# Patient Record
Sex: Female | Born: 1960 | Race: Black or African American | Hispanic: No | Marital: Single | Smoking: Current every day smoker
Health system: Southern US, Community
[De-identification: ages and names within clinical notes are randomized; demographics above are authoritative.]

## PROBLEM LIST (undated history)

## (undated) ENCOUNTER — Ambulatory Visit (HOSPITAL_COMMUNITY)

## (undated) DIAGNOSIS — H409 Unspecified glaucoma: Secondary | ICD-10-CM

## (undated) DIAGNOSIS — E119 Type 2 diabetes mellitus without complications: Secondary | ICD-10-CM

## (undated) DIAGNOSIS — D219 Benign neoplasm of connective and other soft tissue, unspecified: Secondary | ICD-10-CM

## (undated) DIAGNOSIS — L732 Hidradenitis suppurativa: Secondary | ICD-10-CM

## (undated) DIAGNOSIS — R569 Unspecified convulsions: Secondary | ICD-10-CM

## (undated) DIAGNOSIS — G35 Multiple sclerosis: Secondary | ICD-10-CM

## (undated) DIAGNOSIS — I10 Essential (primary) hypertension: Secondary | ICD-10-CM

## (undated) HISTORY — PX: MOUTH SURGERY: SHX715

## (undated) HISTORY — DX: Unspecified glaucoma: H40.9

## (undated) HISTORY — PX: UTERINE FIBROID SURGERY: SHX826

## (undated) HISTORY — PX: PERINEAL HIDRADENITIS EXCISION: SUR524

## (undated) HISTORY — DX: Multiple sclerosis: G35

---

## 1998-04-19 ENCOUNTER — Ambulatory Visit (HOSPITAL_COMMUNITY): Admission: RE | Admit: 1998-04-19 | Discharge: 1998-04-19 | Payer: Self-pay | Admitting: Specialist

## 1998-04-22 ENCOUNTER — Ambulatory Visit (HOSPITAL_BASED_OUTPATIENT_CLINIC_OR_DEPARTMENT_OTHER): Admission: RE | Admit: 1998-04-22 | Discharge: 1998-04-22 | Payer: Self-pay | Admitting: Specialist

## 1999-09-07 ENCOUNTER — Emergency Department (HOSPITAL_COMMUNITY): Admission: EM | Admit: 1999-09-07 | Discharge: 1999-09-07 | Payer: Self-pay | Admitting: Family Medicine

## 1999-11-08 ENCOUNTER — Emergency Department (HOSPITAL_COMMUNITY): Admission: EM | Admit: 1999-11-08 | Discharge: 1999-11-08 | Payer: Self-pay

## 2000-02-28 ENCOUNTER — Emergency Department (HOSPITAL_COMMUNITY): Admission: EM | Admit: 2000-02-28 | Discharge: 2000-02-28 | Payer: Self-pay | Admitting: Emergency Medicine

## 2000-03-22 ENCOUNTER — Encounter: Payer: Self-pay | Admitting: Emergency Medicine

## 2000-03-22 ENCOUNTER — Emergency Department (HOSPITAL_COMMUNITY): Admission: EM | Admit: 2000-03-22 | Discharge: 2000-03-22 | Payer: Self-pay | Admitting: Unknown Physician Specialty

## 2000-04-18 ENCOUNTER — Emergency Department (HOSPITAL_COMMUNITY): Admission: EM | Admit: 2000-04-18 | Discharge: 2000-04-18 | Payer: Self-pay | Admitting: Emergency Medicine

## 2001-03-13 ENCOUNTER — Emergency Department (HOSPITAL_COMMUNITY): Admission: EM | Admit: 2001-03-13 | Discharge: 2001-03-13 | Payer: Self-pay | Admitting: Emergency Medicine

## 2002-02-06 ENCOUNTER — Emergency Department (HOSPITAL_COMMUNITY): Admission: EM | Admit: 2002-02-06 | Discharge: 2002-02-06 | Payer: Self-pay | Admitting: Emergency Medicine

## 2002-02-06 ENCOUNTER — Encounter: Payer: Self-pay | Admitting: Emergency Medicine

## 2002-12-01 ENCOUNTER — Encounter: Payer: Self-pay | Admitting: Emergency Medicine

## 2002-12-01 ENCOUNTER — Emergency Department (HOSPITAL_COMMUNITY): Admission: EM | Admit: 2002-12-01 | Discharge: 2002-12-01 | Payer: Self-pay | Admitting: Emergency Medicine

## 2004-02-12 ENCOUNTER — Inpatient Hospital Stay (HOSPITAL_COMMUNITY): Admission: AD | Admit: 2004-02-12 | Discharge: 2004-02-12 | Payer: Self-pay | Admitting: *Deleted

## 2004-12-11 ENCOUNTER — Encounter: Admission: RE | Admit: 2004-12-11 | Discharge: 2004-12-11 | Payer: Self-pay | Admitting: Internal Medicine

## 2005-11-25 ENCOUNTER — Emergency Department (HOSPITAL_COMMUNITY): Admission: EM | Admit: 2005-11-25 | Discharge: 2005-11-25 | Payer: Self-pay | Admitting: Family Medicine

## 2006-02-16 ENCOUNTER — Emergency Department (HOSPITAL_COMMUNITY): Admission: EM | Admit: 2006-02-16 | Discharge: 2006-02-16 | Payer: Self-pay | Admitting: Family Medicine

## 2006-04-07 ENCOUNTER — Emergency Department (HOSPITAL_COMMUNITY): Admission: EM | Admit: 2006-04-07 | Discharge: 2006-04-07 | Payer: Self-pay | Admitting: Family Medicine

## 2006-04-27 ENCOUNTER — Emergency Department (HOSPITAL_COMMUNITY): Admission: EM | Admit: 2006-04-27 | Discharge: 2006-04-27 | Payer: Self-pay | Admitting: Family Medicine

## 2006-05-18 ENCOUNTER — Encounter: Admission: RE | Admit: 2006-05-18 | Discharge: 2006-05-18 | Payer: Self-pay | Admitting: Obstetrics and Gynecology

## 2006-09-14 ENCOUNTER — Emergency Department (HOSPITAL_COMMUNITY): Admission: EM | Admit: 2006-09-14 | Discharge: 2006-09-14 | Payer: Self-pay | Admitting: Family Medicine

## 2006-10-25 ENCOUNTER — Emergency Department (HOSPITAL_COMMUNITY): Admission: EM | Admit: 2006-10-25 | Discharge: 2006-10-25 | Payer: Self-pay | Admitting: Family Medicine

## 2007-02-01 ENCOUNTER — Emergency Department (HOSPITAL_COMMUNITY): Admission: EM | Admit: 2007-02-01 | Discharge: 2007-02-01 | Payer: Self-pay | Admitting: Family Medicine

## 2009-02-05 ENCOUNTER — Encounter: Admission: RE | Admit: 2009-02-05 | Discharge: 2009-02-05 | Payer: Self-pay | Admitting: Infectious Diseases

## 2009-12-23 ENCOUNTER — Emergency Department (HOSPITAL_COMMUNITY): Admission: EM | Admit: 2009-12-23 | Discharge: 2009-12-23 | Payer: Self-pay | Admitting: Emergency Medicine

## 2012-02-29 ENCOUNTER — Other Ambulatory Visit: Payer: Self-pay

## 2012-02-29 ENCOUNTER — Emergency Department (INDEPENDENT_AMBULATORY_CARE_PROVIDER_SITE_OTHER)
Admission: EM | Admit: 2012-02-29 | Discharge: 2012-02-29 | Disposition: A | Discharge status: Home / Self Care | Payer: Self-pay | Source: Home / Self Care | Attending: Emergency Medicine | Admitting: Emergency Medicine

## 2012-02-29 ENCOUNTER — Encounter (HOSPITAL_COMMUNITY): Payer: Self-pay | Admitting: *Deleted

## 2012-02-29 DIAGNOSIS — S39012A Strain of muscle, fascia and tendon of lower back, initial encounter: Secondary | ICD-10-CM

## 2012-02-29 DIAGNOSIS — R0789 Other chest pain: Secondary | ICD-10-CM

## 2012-02-29 DIAGNOSIS — S139XXA Sprain of joints and ligaments of unspecified parts of neck, initial encounter: Secondary | ICD-10-CM

## 2012-02-29 DIAGNOSIS — S335XXA Sprain of ligaments of lumbar spine, initial encounter: Secondary | ICD-10-CM

## 2012-02-29 DIAGNOSIS — S161XXA Strain of muscle, fascia and tendon at neck level, initial encounter: Secondary | ICD-10-CM

## 2012-02-29 DIAGNOSIS — R071 Chest pain on breathing: Secondary | ICD-10-CM

## 2012-02-29 HISTORY — DX: Hidradenitis suppurativa: L73.2

## 2012-02-29 HISTORY — DX: Benign neoplasm of connective and other soft tissue, unspecified: D21.9

## 2012-02-29 MED ORDER — IBUPROFEN 800 MG PO TABS
ORAL_TABLET | ORAL | Status: AC
  Filled 2012-02-29: qty 1

## 2012-02-29 MED ORDER — CYCLOBENZAPRINE HCL 10 MG PO TABS: 10.0000 mg | ORAL_TABLET | Freq: Three times a day (TID) | ORAL | Status: AC | PRN

## 2012-02-29 MED ORDER — HYDROCODONE-ACETAMINOPHEN 5-325 MG PO TABS: 2.0000 | ORAL_TABLET | ORAL | Status: AC | PRN

## 2012-02-29 MED ORDER — IBUPROFEN 800 MG PO TABS
800.0000 mg | ORAL_TABLET | Freq: Once | ORAL | Status: AC
  Administered 2012-02-29: 800 mg via ORAL

## 2012-02-29 MED ORDER — IBUPROFEN 600 MG PO TABS: 600.0000 mg | ORAL_TABLET | Freq: Four times a day (QID) | ORAL | Status: AC | PRN
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
## 2013-04-11 ENCOUNTER — Encounter (HOSPITAL_COMMUNITY): Payer: Self-pay | Admitting: Emergency Medicine

## 2013-04-11 DIAGNOSIS — L408 Other psoriasis: Secondary | ICD-10-CM | POA: Insufficient documentation

## 2013-04-11 NOTE — ED Notes (Signed)
PT. REPORTS BLISTERS AT BOTTOM OF RIGHT FOOT , STATES STANDING LONG HOURS AT WORK , AMBULATORY.

## 2013-04-12 ENCOUNTER — Telehealth (HOSPITAL_COMMUNITY): Payer: Self-pay | Admitting: Emergency Medicine

## 2013-04-12 ENCOUNTER — Emergency Department (HOSPITAL_COMMUNITY)
Admission: EM | Admit: 2013-04-12 | Discharge: 2013-04-12 | Disposition: A | Discharge status: Home / Self Care | Payer: BC Managed Care – PPO | Attending: Emergency Medicine | Admitting: Emergency Medicine

## 2013-04-12 NOTE — ED Notes (Signed)
Please see downtime charting

## 2013-09-04 ENCOUNTER — Observation Stay (HOSPITAL_COMMUNITY): Payer: BC Managed Care – PPO

## 2013-09-04 ENCOUNTER — Encounter (HOSPITAL_COMMUNITY): Payer: Self-pay | Admitting: *Deleted

## 2013-09-04 ENCOUNTER — Emergency Department (HOSPITAL_COMMUNITY): Payer: BC Managed Care – PPO

## 2013-09-04 ENCOUNTER — Observation Stay (HOSPITAL_COMMUNITY)
Admission: EM | Admit: 2013-09-04 | Discharge: 2013-09-04 | Disposition: A | Discharge status: Home / Self Care | Payer: BC Managed Care – PPO | Attending: Emergency Medicine | Admitting: Emergency Medicine

## 2013-09-04 DIAGNOSIS — Z872 Personal history of diseases of the skin and subcutaneous tissue: Secondary | ICD-10-CM | POA: Insufficient documentation

## 2013-09-04 DIAGNOSIS — M25512 Pain in left shoulder: Secondary | ICD-10-CM | POA: Diagnosis present

## 2013-09-04 DIAGNOSIS — R4789 Other speech disturbances: Secondary | ICD-10-CM

## 2013-09-04 DIAGNOSIS — F17200 Nicotine dependence, unspecified, uncomplicated: Secondary | ICD-10-CM | POA: Insufficient documentation

## 2013-09-04 DIAGNOSIS — R0789 Other chest pain: Principal | ICD-10-CM | POA: Insufficient documentation

## 2013-09-04 DIAGNOSIS — M719 Bursopathy, unspecified: Secondary | ICD-10-CM | POA: Insufficient documentation

## 2013-09-04 DIAGNOSIS — R110 Nausea: Secondary | ICD-10-CM | POA: Insufficient documentation

## 2013-09-04 DIAGNOSIS — R4781 Slurred speech: Secondary | ICD-10-CM | POA: Diagnosis present

## 2013-09-04 DIAGNOSIS — R079 Chest pain, unspecified: Secondary | ICD-10-CM

## 2013-09-04 DIAGNOSIS — R030 Elevated blood-pressure reading, without diagnosis of hypertension: Secondary | ICD-10-CM

## 2013-09-04 DIAGNOSIS — M75102 Unspecified rotator cuff tear or rupture of left shoulder, not specified as traumatic: Secondary | ICD-10-CM

## 2013-09-04 DIAGNOSIS — G459 Transient cerebral ischemic attack, unspecified: Secondary | ICD-10-CM

## 2013-09-04 DIAGNOSIS — M25519 Pain in unspecified shoulder: Secondary | ICD-10-CM

## 2013-09-04 DIAGNOSIS — M67919 Unspecified disorder of synovium and tendon, unspecified shoulder: Secondary | ICD-10-CM

## 2013-09-04 DIAGNOSIS — F411 Generalized anxiety disorder: Secondary | ICD-10-CM | POA: Diagnosis present

## 2013-09-04 DIAGNOSIS — Z8742 Personal history of other diseases of the female genital tract: Secondary | ICD-10-CM | POA: Insufficient documentation

## 2013-09-04 DIAGNOSIS — R0602 Shortness of breath: Secondary | ICD-10-CM | POA: Insufficient documentation

## 2013-09-04 HISTORY — DX: Unspecified convulsions: R56.9

## 2013-09-04 HISTORY — DX: Essential (primary) hypertension: I10

## 2013-09-04 LAB — CBC WITH DIFFERENTIAL/PLATELET
Basophils Relative: 0 % (ref 0–1)
Eosinophils Absolute: 0.1 10*3/uL (ref 0.0–0.7)
Hemoglobin: 13.9 g/dL (ref 12.0–15.0)
Lymphocytes Relative: 50 % — ABNORMAL HIGH (ref 12–46)
Lymphs Abs: 3.4 10*3/uL (ref 0.7–4.0)
MCH: 28.5 pg (ref 26.0–34.0)
MCHC: 34.7 g/dL (ref 30.0–36.0)
MCV: 82.3 fL (ref 78.0–100.0)
Monocytes Absolute: 0.6 10*3/uL (ref 0.1–1.0)
Neutro Abs: 2.6 10*3/uL (ref 1.7–7.7)
Neutrophils Relative %: 39 % — ABNORMAL LOW (ref 43–77)
RBC: 4.87 MIL/uL (ref 3.87–5.11)

## 2013-09-04 LAB — COMPREHENSIVE METABOLIC PANEL
Albumin: 3.9 g/dL (ref 3.5–5.2)
Alkaline Phosphatase: 90 U/L (ref 39–117)
Chloride: 101 mEq/L (ref 96–112)
GFR calc non Af Amer: >90 mL/min (ref >90)
Sodium: 140 mEq/L (ref 135–145)

## 2013-09-04 LAB — LIPID PANEL
Cholesterol: 184 mg/dL (ref 0–200)
HDL: 51 mg/dL (ref >39)
LDL Cholesterol: 114 mg/dL — ABNORMAL HIGH (ref 0–99)
Total CHOL/HDL Ratio: 3.6 RATIO
Triglycerides: 97 mg/dL (ref <150)

## 2013-09-04 LAB — TROPONIN I: Troponin I: <0.3 ng/mL (ref <0.30)

## 2013-09-04 LAB — HEMOGLOBIN A1C: Hgb A1c MFr Bld: 6 % — ABNORMAL HIGH (ref <5.7)

## 2013-09-04 MED ORDER — ACETAMINOPHEN 500 MG PO TABS: 500.0000 mg | ORAL_TABLET | Freq: Four times a day (QID) | ORAL | Status: DC | PRN

## 2013-09-04 MED ORDER — HEPARIN SODIUM (PORCINE) 5000 UNIT/ML IJ SOLN
5000.0000 [IU] | Freq: Three times a day (TID) | INTRAMUSCULAR | Status: DC
  Administered 2013-09-04: 5000 [IU] via SUBCUTANEOUS
  Filled 2013-09-04 (×3): qty 1

## 2013-09-04 MED ORDER — HYDROCODONE-ACETAMINOPHEN 5-325 MG PO TABS
1.0000 | ORAL_TABLET | Freq: Four times a day (QID) | ORAL | Status: DC | PRN
  Administered 2013-09-04 (×2): 1 via ORAL
  Filled 2013-09-04 (×2): qty 1

## 2013-09-04 MED ORDER — OXYCODONE HCL 5 MG PO TABS: 5.0000 mg | ORAL_TABLET | ORAL | Status: DC | PRN

## 2013-09-04 NOTE — ED Provider Notes (Signed)
CSN: [id number]     Arrival date & time 09/04/13  0247 History   First MD Initiated Contact with Patient 09/04/13 0319     Chief Complaint  Patient presents with  . Shoulder Pain   (Consider location/radiation/quality/duration/timing/severity/associated sxs/prior Treatment) HPI This is a 52 year old female with several days of pain in her left shoulder. The pain is worse with movement and limits abduction. She is here this morning with worsening shoulder pain as well as chest pain which began just prior to arrival. She describes the chest pain as a tightness. There was shortness of breath associated with it as well as some nausea but no diaphoresis. While her daughter was driving her to the hospital she had about a 10 minute episode of slurred speech which was witnessed by her daughter. She does not know she had any associated weakness with this. The slurred speech has resolved. The pain in her shoulder is severe, the pain in her chest is mild.   Past Medical History  Diagnosis Date  . Fibroid   . Hidradenitis    Past Surgical History  Procedure Laterality Date  . Uterine fibroid surgery    . Perineal hidradenitis excision    . Mouth surgery     History reviewed. No pertinent family history. History  Substance Use Topics  . Smoking status: Current Every Day Smoker  . Smokeless tobacco: Not on file  . Alcohol Use: Yes     Comment: occasional   OB History   Grav Para Term Preterm Abortions TAB SAB Ect Mult Living                 Review of Systems  All other systems reviewed and are negative.    Allergies  Apple; Artichoke; and Peach  Home Medications   Current Outpatient Rx  Name  Route  Sig  Dispense  Refill  . ibuprofen (ADVIL,MOTRIN) 200 MG tablet   Oral   Take 200 mg by mouth every 6 (six) hours as needed for pain.         . naproxen sodium (ANAPROX) 220 MG tablet   Oral   Take 220 mg by mouth daily as needed (for pain).         Marland Kitchen  neomycin-bacitracin-polymyxin (NEOSPORIN) ointment   Topical   Apply 1 application topically daily as needed (for cut).          BP 146/90  Pulse 70  Temp(Src) 98.3 F (36.8 C) (Oral)  Resp 13  Ht 5\' 4"  (1.626 m)  Wt 130 lb (58.968 kg)  BMI 22.3 kg/m2  SpO2 100%  Physical Exam General: Well-developed, well-nourished female in no acute distress; appearance consistent with age of record HENT: normocephalic; atraumatic Eyes: pupils equal, round and reactive to light; extraocular muscles intact Neck: supple Heart: regular rate and rhythm; no murmurs, rubs or gallops Lungs: clear to auscultation bilaterally Chest: Left pectoral tenderness Abdomen: soft; nondistended; nontender; no masses or hepatosplenomegaly; bowel sounds present Extremities: No deformity; decreased range of motion of left shoulder due to pain; pain on range of motion of left shoulder Neurologic: Awake, alert and oriented; motor function intact in all extremities and symmetric; no facial droop; no dysarthria Skin: Warm and dry Psychiatric: Flat affect    ED Course  Procedures (including critical care time)    MDM   Nursing notes and vitals signs, including pulse oximetry, reviewed.  Summary of this visit's results, reviewed by myself:  Labs:  Results for orders placed during the hospital  encounter of 09/04/13 (from the past 24 hour(s))  CBC WITH DIFFERENTIAL     Status: Abnormal   Collection Time    09/04/13  3:43 AM      Result Value Range   WBC 6.7  4.0 - 10.5 K/uL   RBC 4.87  3.87 - 5.11 MIL/uL   Hemoglobin 13.9  12.0 - 15.0 g/dL   HCT 40.9  81.1 - 91.4 %   MCV 82.3  78.0 - 100.0 fL   MCH 28.5  26.0 - 34.0 pg   MCHC 34.7  30.0 - 36.0 g/dL   RDW 78.2  95.6 - 21.3 %   Platelets 199  150 - 400 K/uL   Neutrophils Relative % 39 (*) 43 - 77 %   Neutro Abs 2.6  1.7 - 7.7 K/uL   Lymphocytes Relative 50 (*) 12 - 46 %   Lymphs Abs 3.4  0.7 - 4.0 K/uL   Monocytes Relative 9  3 - 12 %   Monocytes  Absolute 0.6  0.1 - 1.0 K/uL   Eosinophils Relative 2  0 - 5 %   Eosinophils Absolute 0.1  0.0 - 0.7 K/uL   Basophils Relative 0  0 - 1 %   Basophils Absolute 0.0  0.0 - 0.1 K/uL  COMPREHENSIVE METABOLIC PANEL     Status: Abnormal   Collection Time    09/04/13  3:43 AM      Result Value Range   Sodium 140  135 - 145 mEq/L   Potassium 3.2 (*) 3.5 - 5.1 mEq/L   Chloride 101  96 - 112 mEq/L   CO2 28  19 - 32 mEq/L   Glucose, Bld 118 (*) 70 - 99 mg/dL   BUN 7  6 - 23 mg/dL   Creatinine, Ser 0.86  0.50 - 1.10 mg/dL   Calcium 9.7  8.4 - 57.8 mg/dL   Total Protein 9.0 (*) 6.0 - 8.3 g/dL   Albumin 3.9  3.5 - 5.2 g/dL   AST 14  0 - 37 U/L   ALT 9  0 - 35 U/L   Alkaline Phosphatase 90  39 - 117 U/L   Total Bilirubin 0.8  0.3 - 1.2 mg/dL   GFR calc non Af Amer >90  >90 mL/min   GFR calc Af Amer >90  >90 mL/min  POCT I-STAT TROPONIN I     Status: None   Collection Time    09/04/13  3:50 AM      Result Value Range   Troponin i, poc 0.00  0.00 - 0.08 ng/mL   Comment 3             Imaging Studies: Dg Chest 2 View  09/04/2013   *RADIOLOGY REPORT*  Clinical Data: Left-sided chest and shoulder pain.  CHEST - 2 VIEW  Comparison: Chest radiograph performed 10/25/2006  Findings: The lungs are well-aerated and clear.  There is no evidence of focal opacification, pleural effusion or pneumothorax.  The heart is normal in size; the mediastinal contour is within normal limits.  No acute osseous abnormalities are seen.  IMPRESSION: No acute cardiopulmonary process seen.   Original Report Authenticated By: Tonia Ghent, M.D.   Ct Head Wo Contrast  09/04/2013   *RADIOLOGY REPORT*  Clinical Data: Left shoulder pain, radiating to the chest.  CT HEAD WITHOUT CONTRAST  Technique:  Contiguous axial images were obtained from the base of the skull through the vertex without contrast.  Comparison: None.  Findings: There is no  evidence of acute infarction, mass lesion, or intra- or extra-axial hemorrhage on CT.   The posterior fossa, including the cerebellum, brainstem and fourth ventricle, is within normal limits.  The third and lateral ventricles, and basal ganglia are unremarkable in appearance.  The cerebral hemispheres are symmetric in appearance, with normal gray- white differentiation.  No mass effect or midline shift is seen.  There is no evidence of fracture; visualized osseous structures are unremarkable in appearance.  The orbits are within normal limits. The paranasal sinuses and mastoid air cells are well-aerated.  No significant soft tissue abnormalities are seen.  IMPRESSION: Unremarkable noncontrast CT of the head.   Original Report Authenticated By: Tonia Ghent, M.D.   EKG Interpretation:  Date & Time: 09/04/2013 2:54 AM  Rate: 80  Rhythm: normal sinus rhythm  QRS Axis: left  Intervals: normal  ST/T Wave abnormalities: normal  Conduction Disutrbances:none  Narrative Interpretation:   Old EKG Reviewed: No significant changes  5:33 AM The patient sleeping comfortably. Her symptoms are concerning for angina as well as a TIA.       Hanley Seamen, MD 09/04/13 [phone]

## 2013-09-04 NOTE — H&P (Signed)
[facility] History and Physical  Alison Perez RUE:[id number] DOB: 10-18-1961 DOA: 09/04/2013  Referring physician: ED PCP: No PCP Per Patient   Chief Complaint: Shoulder pain  HPI: Alison Perez is a 52 y.o. female with several days of severe pain in her Left shoulder.  Because of the pain which is worse with abduction, better at rest, she presented to the ED.  More concerning to the medical service however is that on the way to ED she had an episode of mild chest pain, as well as an episode of 10 mins of slurred speech while in the car.  Daughter did not notice any facial droop (she was busy driving the car at the time), and patient did not note any unilateral weakness during this episode.  Chest pain is described as a tightness, there was associated shortness of breath, there were no modifying factors.  Review of Systems: 12 systems reviewed and otherwise negative.  Past Medical History  Diagnosis Date  . Fibroid   . Hidradenitis    Past Surgical History  Procedure Laterality Date  . Uterine fibroid surgery    . Perineal hidradenitis excision    . Mouth surgery     Social History:  reports that she has been smoking.  She does not have any smokeless tobacco history on file. She reports that  drinks alcohol. She reports that she does not use illicit drugs.   Allergies  Allergen Reactions  . Apple Nausea And Vomiting  . Artichoke [Cynara Scolymus (Artichoke)] Nausea Only  . Peach [Prunus Persica] Itching    History reviewed. No pertinent family history.  Prior to Admission medications   Medication Sig Start Date End Date Taking? Authorizing Provider  ibuprofen (ADVIL,MOTRIN) 200 MG tablet Take 200 mg by mouth every 6 (six) hours as needed for pain.   Yes Historical Provider, MD  naproxen sodium (ANAPROX) 220 MG tablet Take 220 mg by mouth daily as needed (for pain).   Yes Historical Provider, MD  neomycin-bacitracin-polymyxin (NEOSPORIN) ointment Apply 1 application  topically daily as needed (for cut).   Yes Historical Provider, MD   Physical Exam: Filed Vitals:   09/04/13 0300  BP: 146/90  Pulse: 70  Temp:   Resp: 13    General:  NAD, resting comfortably in bed Eyes: PEERLA EOMI ENT: mucous membranes moist Neck: supple w/o JVD Cardiovascular: RRR w/o MRG Respiratory: CTA B Abdomen: soft, nt, nd, bs+ Skin: no rash nor lesion Musculoskeletal: MAE, pain at L shoulder with abduction, pain is worst right at the insertion of the supraspinatus Psychiatric: normal tone and affect Neurologic: AAOx3, grossly non-focal  Labs on Admission:  Basic Metabolic Panel:  Recent Labs Lab 09/04/13 0343  NA 140  K 3.2*  CL 101  CO2 28  GLUCOSE 118*  BUN 7  CREATININE 0.58  CALCIUM 9.7   Liver Function Tests:  Recent Labs Lab 09/04/13 0343  AST 14  ALT 9  ALKPHOS 90  BILITOT 0.8  PROT 9.0*  ALBUMIN 3.9   No results found for this basename: LIPASE, AMYLASE,  in the last 168 hours No results found for this basename: AMMONIA,  in the last 168 hours CBC:  Recent Labs Lab 09/04/13 0343  WBC 6.7  NEUTROABS 2.6  HGB 13.9  HCT 40.1  MCV 82.3  PLT 199   Cardiac Enzymes: No results found for this basename: CKTOTAL, CKMB, CKMBINDEX, TROPONINI,  in the last 168 hours  BNP (last 3 results) No results found for this basename: PROBNP,  in the last 8760 hours CBG: No results found for this basename: GLUCAP,  in the last 168 hours  Radiological Exams on Admission: Dg Chest 2 View  09/04/2013   *RADIOLOGY REPORT*  Clinical Data: Left-sided chest and shoulder pain.  CHEST - 2 VIEW  Comparison: Chest radiograph performed 10/25/2006  Findings: The lungs are well-aerated and clear.  There is no evidence of focal opacification, pleural effusion or pneumothorax.  The heart is normal in size; the mediastinal contour is within normal limits.  No acute osseous abnormalities are seen.  IMPRESSION: No acute cardiopulmonary process seen.   Original Report  Authenticated By: Tonia Ghent, M.D.   Ct Head Wo Contrast  09/04/2013   *RADIOLOGY REPORT*  Clinical Data: Left shoulder pain, radiating to the chest.  CT HEAD WITHOUT CONTRAST  Technique:  Contiguous axial images were obtained from the base of the skull through the vertex without contrast.  Comparison: None.  Findings: There is no evidence of acute infarction, mass lesion, or intra- or extra-axial hemorrhage on CT.  The posterior fossa, including the cerebellum, brainstem and fourth ventricle, is within normal limits.  The third and lateral ventricles, and basal ganglia are unremarkable in appearance.  The cerebral hemispheres are symmetric in appearance, with normal gray- white differentiation.  No mass effect or midline shift is seen.  There is no evidence of fracture; visualized osseous structures are unremarkable in appearance.  The orbits are within normal limits. The paranasal sinuses and mastoid air cells are well-aerated.  No significant soft tissue abnormalities are seen.  IMPRESSION: Unremarkable noncontrast CT of the head.   Original Report Authenticated By: Tonia Ghent, M.D.    EKG: Independently reviewed.  Assessment/Plan Principal Problem:   Slurred speech Active Problems:   Left shoulder pain   Chest pain   Elevated blood-pressure reading without diagnosis of hypertension   1. Slurred Speech - concerning for possible TIA vs PRESS secondary to as of yet undiagnosed HTN, patient being admitted under stroke pathway, ordering MRI brain, if positive then will of course need the remainder of the stroke work up.  Am suspicious that the patient has undiagnosed HTN given that her BP on arrival was 173/90, if it was even further elevated earlier today this alone could have explained both her slurred speech and her chest pain.  Monitor blood pressures while inpatient and treat if elevates further.  Alternatively the elevated BP could be secondary to a TIA. 2. Chest pain - while patient  technically is a HEART score of just 2, am suspicious that this may rise somewhat as we uncover undiagnosed (and untreated) chronic diseases (IE HTN), have also ordered lipid panel and A1C 3. Left shoulder pain - classic for rotator cuff syndrome, PT evaluating patient for this, may also benefit from outpatient follow up with ortho.  Unfortunately although the patient initially was coming to the hospital for this as her chief complaint, issues #1 and #2 which occurred on the way to the hospital have taken precedence.    Code Status: Full (must indicate code status--if unknown or must be presumed, indicate so) Family Communication: Spoke with daughter at bedside (indicate person spoken with, if applicable, with phone number if by telephone) Disposition Plan: Admit to obs (indicate anticipated LOS)  Time spent: 70 min  Raahi Korber M. [facility] Pager [phone]  If 7PM-7AM, please contact night-coverage [email or web address] Password [email or web address] 09/04/2013, 6:08 AM

## 2013-09-04 NOTE — ED Notes (Signed)
Dr. Gardener at bedside. 

## 2013-09-04 NOTE — Progress Notes (Signed)
Reviewed discharge instructions with patient and family, they stated their understanding.  Set up appointment made with Orthopedics.  Discharged home with family via wheelchair.  Colman Cater

## 2013-09-04 NOTE — ED Notes (Signed)
Attempted report x1. 

## 2013-09-04 NOTE — ED Notes (Signed)
Pt daughter reports that during car ride over to this ED the patient began to experience slurred speech and felt like her throat was closing up. Upon her arrival to this ED, speech was clear, pt speaking in clear, concise sentences. No neuro deficits noted.

## 2013-09-04 NOTE — ED Notes (Signed)
Pt states that she has left shoulder pain that radiates down to her chest. Pt states that she has increased pain with movement. Pt states that a majority of her pain is in the left shoulder. Pt alert and oriented, no neuro deficits.

## 2013-09-04 NOTE — Progress Notes (Signed)
Utilization review completed.  

## 2013-09-04 NOTE — Discharge Summary (Signed)
Physician Discharge Summary  Alison Perez XBJ:[id number] DOB: 03/24/1961 DOA: 09/04/2013  PCP: No PCP Per Patient  Admit date: 09/04/2013 Discharge date: 09/04/2013  Time spent: 25 minutes  Recommendations for Outpatient Follow-up:  And shoulder issues concern for rotator cuff injury, patient will follow up with with orthopedic surgery Discharge Diagnoses:  Principal Problem:   Slurred speech Active Problems:   Left shoulder pain:   Chest pain   Elevated blood-pressure reading without diagnosis of hypertension   Discharge Condition: Improved, being discharged home  Diet recommendation: Regular  Filed Weights   09/04/13 0257  Weight: 58.968 kg (130 lb)    History of present illness:  Early morning 10/6:Alison Perez is a 52 y.o. female with several days of severe pain in her Left shoulder. Because of the pain which is worse with abduction, better at rest, she presented to the ED. More concerning to the medical service however is that on the way to ED she had an episode of mild chest pain, as well as an episode of 10 mins of slurred speech while in the car. Daughter did not notice any facial droop (she was busy driving the car at the time), and patient did not note any unilateral weakness during this episode. Chest pain is described as a tightness, there was associated shortness of breath, there were no modifying factors.   Hospital Course:  Principal Problem:   Slurred speech: CT scan and MRI were negative for any acute finding in regards to CVA or signs of ischemia. In discussion with the patient, she stated that her slurred speech was also accompanied by throat being dry and palpitations. I felt that likely this is more anxiety. She was had no other neurological findings and it was of note that her speech was clear in the emergency room. In addition, the concern for high for high blood pressure which completely normalized on its own without any medication intervention. Signed  this is more likely anxiety Active Problems:   Left shoulder pain: Upon examination, I agree my colleague who admitted her that this is most likely rotator cuff injury. Give patient prescription for pain medications and refer to orthopedic surgery    Chest pain: Normal troponin, normal EKG.  Looks to be noncardiac. This is more likely anxiety    Elevated blood-pressure reading without diagnosis of hypertension: Resolved. Felt to be secondary to anxiety   Procedures:  None  Consultations:  None  Discharge Exam: Filed Vitals:   09/04/13 1100  BP: 101/63  Pulse: 54  Temp: 97.7 F (36.5 C)  Resp: 17    General: Alert and oriented x3, no acute distress Cardiovascular: Regular rate and rhythm, S1-S2 Respiratory: Clear to auscultation bilaterally Abdomen: Soft, nontender, nondistended, positive bowel sounds Musculoskeletal: Left arm with minimal pain with abduction but on downward descent, significant pain in the left shoulder area  Discharge Instructions  Discharge Orders   Future Orders Complete By Expires   Diet general  As directed    Increase activity slowly  As directed        Medication List    STOP taking these medications       ibuprofen 200 MG tablet  Commonly known as:  ADVIL,MOTRIN      TAKE these medications       acetaminophen 500 MG tablet  Commonly known as:  TYLENOL  Take 1 tablet (500 mg total) by mouth every 6 (six) hours as needed for pain.     naproxen sodium 220 MG tablet  Commonly known as:  ANAPROX  Take 220 mg by mouth daily as needed (for pain).     neomycin-bacitracin-polymyxin ointment  Commonly known as:  NEOSPORIN  Apply 1 application topically daily as needed (for cut).     oxyCODONE 5 MG immediate release tablet  Commonly known as:  Oxy IR/ROXICODONE  Take 1 tablet (5 mg total) by mouth every 4 (four) hours as needed for pain.       Allergies  Allergen Reactions  . Apple Nausea And Vomiting  . Artichoke [Cynara Scolymus  (Artichoke)] Nausea Only  . Peach [Prunus Persica] Itching      The results of significant diagnostics from this hospitalization (including imaging, microbiology, ancillary and laboratory) are listed below for reference.    Significant Diagnostic Studies: Dg Chest 2 View  09/04/2013   *RADIOLOGY REPORT*  Clinical Data: Left-sided chest and shoulder pain.  CHEST - 2 VIEW  Comparison: Chest radiograph performed 10/25/2006  Findings: The lungs are well-aerated and clear.  There is no evidence of focal opacification, pleural effusion or pneumothorax.  The heart is normal in size; the mediastinal contour is within normal limits.  No acute osseous abnormalities are seen.  IMPRESSION: No acute cardiopulmonary process seen.   Original Report Authenticated By: Tonia Ghent, M.D.   Ct Head Wo Contrast  09/04/2013   *RADIOLOGY REPORT*  Clinical Data: Left shoulder pain, radiating to the chest.  CT HEAD WITHOUT CONTRAST  Technique:  Contiguous axial images were obtained from the base of the skull through the vertex without contrast.  Comparison: None.  Findings: There is no evidence of acute infarction, mass lesion, or intra- or extra-axial hemorrhage on CT.  The posterior fossa, including the cerebellum, brainstem and fourth ventricle, is within normal limits.  The third and lateral ventricles, and basal ganglia are unremarkable in appearance.  The cerebral hemispheres are symmetric in appearance, with normal gray- white differentiation.  No mass effect or midline shift is seen.  There is no evidence of fracture; visualized osseous structures are unremarkable in appearance.  The orbits are within normal limits. The paranasal sinuses and mastoid air cells are well-aerated.  No significant soft tissue abnormalities are seen.  IMPRESSION: Unremarkable noncontrast CT of the head.   Original Report Authenticated By: Tonia Ghent, M.D.   Mr Brain Wo Contrast  09/04/2013   CLINICAL DATA:  TIA. 10 min episode of  slurred speech.  EXAM: MRI HEAD WITHOUT CONTRAST  TECHNIQUE: Multiplanar, multisequence MR imaging was performed. No intravenous contrast was administered.  COMPARISON:  CT head without contrast 09/04/2013  FINDINGS: Midline structures are within normal limits. The diffusion-weighted images demonstrate no evidence for acute or subacute infarction. No significant white matter disease is present. The ventricles are of normal size. No significant extra-axial fluid collection is present. Flow is present in the major intracranial arteries. The globes and orbits are intact. Mild mucosal thickening is present in the maxillary sinuses bilaterally. The remaining paranasal sinuses are clear. There is some fluid in the right mastoid air cells. No obstructing nasopharyngeal lesion is present. There is mild prominence of the adenoid tissue.  IMPRESSION: 1. Normal MRI appearance of the brain. 2. Mild maxillary sinus disease, left greater than right. 3. Small right mastoid effusion. 4. Prominent adenoid tissue. This is most commonly seen in the setting of an acute upper respiratory infection. In the absence of the other disease, HIV could also be considered.   Electronically Signed   By: Gennette Pac M.D.   On: 09/04/2013  08:29    Microbiology: No results found for this or any previous visit (from the past 240 hour(s)).   Labs: Basic Metabolic Panel:  Recent Labs Lab 09/04/13 0343  NA 140  K 3.2*  CL 101  CO2 28  GLUCOSE 118*  BUN 7  CREATININE 0.58  CALCIUM 9.7   Liver Function Tests:  Recent Labs Lab 09/04/13 0343  AST 14  ALT 9  ALKPHOS 90  BILITOT 0.8  PROT 9.0*  ALBUMIN 3.9   No results found for this basename: LIPASE, AMYLASE,  in the last 168 hours No results found for this basename: AMMONIA,  in the last 168 hours CBC:  Recent Labs Lab 09/04/13 0343  WBC 6.7  NEUTROABS 2.6  HGB 13.9  HCT 40.1  MCV 82.3  PLT 199   Cardiac Enzymes:  Recent Labs Lab 09/04/13 0903   TROPONINI <0.30   BNP: BNP (last 3 results) No results found for this basename: PROBNP,  in the last 8760 hours CBG: No results found for this basename: GLUCAP,  in the last 168 hours     Signed:  Hollice Espy  [facility] 09/04/2013, 3:42 PM

## 2014-05-09 DIAGNOSIS — Z791 Long term (current) use of non-steroidal anti-inflammatories (NSAID): Secondary | ICD-10-CM | POA: Insufficient documentation

## 2014-05-09 DIAGNOSIS — F17200 Nicotine dependence, unspecified, uncomplicated: Secondary | ICD-10-CM | POA: Insufficient documentation

## 2014-05-09 DIAGNOSIS — M436 Torticollis: Secondary | ICD-10-CM | POA: Insufficient documentation

## 2014-05-09 DIAGNOSIS — Z8669 Personal history of other diseases of the nervous system and sense organs: Secondary | ICD-10-CM | POA: Insufficient documentation

## 2014-05-09 DIAGNOSIS — Z8742 Personal history of other diseases of the female genital tract: Secondary | ICD-10-CM | POA: Insufficient documentation

## 2014-05-09 DIAGNOSIS — Z79899 Other long term (current) drug therapy: Secondary | ICD-10-CM | POA: Insufficient documentation

## 2014-05-09 DIAGNOSIS — I10 Essential (primary) hypertension: Secondary | ICD-10-CM | POA: Insufficient documentation

## 2014-05-09 DIAGNOSIS — Z872 Personal history of diseases of the skin and subcutaneous tissue: Secondary | ICD-10-CM | POA: Insufficient documentation

## 2014-05-10 ENCOUNTER — Encounter (HOSPITAL_COMMUNITY): Payer: Self-pay | Admitting: Emergency Medicine

## 2014-05-10 ENCOUNTER — Emergency Department (HOSPITAL_COMMUNITY)
Admission: EM | Admit: 2014-05-10 | Discharge: 2014-05-10 | Disposition: A | Discharge status: Home / Self Care | Payer: BC Managed Care – PPO | Attending: Emergency Medicine | Admitting: Emergency Medicine

## 2014-05-10 DIAGNOSIS — M436 Torticollis: Secondary | ICD-10-CM

## 2014-05-10 MED ORDER — OXYCODONE-ACETAMINOPHEN 5-325 MG PO TABS: 1.0000 | ORAL_TABLET | ORAL | Status: DC | PRN

## 2014-05-10 MED ORDER — METHOCARBAMOL 500 MG PO TABS
500.0000 mg | ORAL_TABLET | Freq: Once | ORAL | Status: AC
  Administered 2014-05-10: 500 mg via ORAL
  Filled 2014-05-10: qty 1

## 2014-05-10 MED ORDER — METHOCARBAMOL 500 MG PO TABS: 500.0000 mg | ORAL_TABLET | Freq: Two times a day (BID) | ORAL | Status: DC

## 2014-05-10 MED ORDER — OXYCODONE-ACETAMINOPHEN 5-325 MG PO TABS
1.0000 | ORAL_TABLET | Freq: Once | ORAL | Status: AC
  Administered 2014-05-10: 1 via ORAL
  Filled 2014-05-10: qty 1

## 2014-05-10 NOTE — ED Notes (Signed)
Here with left sided neck pain, states no injury. Neck tightness is currently pulling into left ear and causing the left side of her head to hurt as well.

## 2014-05-10 NOTE — ED Provider Notes (Signed)
CSN: [id number]     Arrival date & time 05/09/14  2326 History   First MD Initiated Contact with Patient 05/10/14 0119     Chief Complaint  Patient presents with  . Neck Pain     (Consider location/radiation/quality/duration/timing/severity/associated sxs/prior Treatment) The history is provided by the patient and medical records. No language interpreter was used.    Alison Perez is a 53 y.o. female  with a hx of fibroid, hidradenitis, hypertension, seizures presents to the Emergency Department complaining of gradual, persistent, progressively worsening left-sided neck pain onset 1 week ago.  Pt reports she had just awoken from sleep when she noticed the pain. Associated symptoms include restricted motion of the neck due to pain.  She has had "pain pills" for the same neck problem in the past but these did not help. He reports seeing [facility] for this and been told that she had "a pinched nerve."  Patient reports that pain medication given to her here in the emergency department has helped significantly. She denies headache or fevers, numbness or weakness in the arms or leg, bowel or bladder dysfunction.  Attempting to move her head makes her pain worse.  Past Medical History  Diagnosis Date  . Fibroid   . Hidradenitis   . Hypertension   . Seizures     " MILD "   Past Surgical History  Procedure Laterality Date  . Uterine fibroid surgery    . Perineal hidradenitis excision    . Mouth surgery     History reviewed. No pertinent family history. History  Substance Use Topics  . Smoking status: Current Every Day Smoker -- 0.25 packs/day for 40 years    Types: Cigarettes  . Smokeless tobacco: Never Used  . Alcohol Use: Yes     Comment: occasional   OB History   Grav Para Term Preterm Abortions TAB SAB Ect Mult Living                 Review of Systems  Constitutional: Negative for fever and fatigue.  Respiratory: Negative for chest tightness and shortness of  breath.   Cardiovascular: Negative for chest pain.  Gastrointestinal: Negative for nausea, vomiting, abdominal pain and diarrhea.  Genitourinary: Negative for dysuria, urgency, frequency and hematuria.  Musculoskeletal: Positive for neck pain. Negative for back pain, gait problem, joint swelling and neck stiffness.  Skin: Negative for rash.  Neurological: Negative for weakness, light-headedness, numbness and headaches.  All other systems reviewed and are negative.     Allergies  Apple; Artichoke; and Peach  Home Medications   Prior to Admission medications   Medication Sig Start Date End Date Taking? Authorizing Provider  acetaminophen (TYLENOL) 500 MG tablet Take 1 tablet (500 mg total) by mouth every 6 (six) hours as needed for pain. 09/04/13   [name], MD  methocarbamol (ROBAXIN) 500 MG tablet Take 1 tablet (500 mg total) by mouth 2 (two) times daily. 05/10/14   Thai Burgueno, PA-C  naproxen sodium (ANAPROX) 220 MG tablet Take 220 mg by mouth daily as needed (for pain).    Historical Provider, MD  neomycin-bacitracin-polymyxin (NEOSPORIN) ointment Apply 1 application topically daily as needed (for cut).    Historical Provider, MD  oxyCODONE (OXY IR/ROXICODONE) 5 MG immediate release tablet Take 1 tablet (5 mg total) by mouth every 4 (four) hours as needed for pain. 09/04/13   [name], MD  oxyCODONE-acetaminophen (PERCOCET/ROXICET) 5-325 MG per tablet Take 1 tablet by mouth every 4 (four) hours  as needed for severe pain. May take 2 tablets PO q 6 hours for severe pain - Do not take with Tylenol as this tablet already contains tylenol 05/10/14   Satori Krabill, PA-C   BP 174/88  Pulse 94  Temp(Src) 99.3 F (37.4 C) (Oral)  Ht 5\' 4"  (1.626 m)  Wt 130 lb (58.968 kg)  BMI 22.30 kg/m2  SpO2 100% Physical Exam  Nursing note and vitals reviewed. Constitutional: She is oriented to person, place, and time. She appears well-developed and well-nourished. No  distress.  HENT:  Head: Normocephalic and atraumatic.  Mouth/Throat: Oropharynx is clear and moist. No oropharyngeal exudate.  Eyes: Conjunctivae are normal.  Neck: Phonation normal. Neck supple. Muscular tenderness present. No spinous process tenderness present. No rigidity. Decreased range of motion present.    Decreased ROM due to pain Pain to palpation along the left sternocleidomastoid Mild tenderness to the left paraspinal muscles, but patient reports this developed only in the last several days No midline tenderness No nuchal rigidity Normal phonation, patent airway  Cardiovascular: Normal rate, regular rhythm, normal heart sounds and intact distal pulses.   No murmur heard. Pulmonary/Chest: Effort normal and breath sounds normal. No stridor. No respiratory distress. She has no wheezes. She has no rales.  Abdominal: Soft. Bowel sounds are normal. She exhibits no distension. There is no tenderness.  Musculoskeletal:  Full range of motion of the T-spine and L-spine No tenderness to palpation of the spinous processes of the T-spine or L-spine No tenderness to palpation of the paraspinous muscles of the L-spine  Lymphadenopathy:    She has no cervical adenopathy.  Neurological: She is alert and oriented to person, place, and time. She has normal reflexes.  Speech is clear and goal oriented, follows commands Normal 5/5 strength in upper and lower extremities bilaterally including dorsiflexion and plantar flexion, strong and equal grip strength Sensation normal to light and sharp touch Moves extremities without ataxia, coordination intact Normal gait Normal balance   Skin: Skin is warm and dry. No rash noted. She is not diaphoretic. No erythema.  Psychiatric: She has a normal mood and affect. Her behavior is normal.    ED Course  Procedures (including critical care time) Labs Review Labs Reviewed - No data to display  Imaging Review No results found.   EKG  Interpretation None      MDM   Final diagnoses:  Torticollis   Alison Perez presents with a history of pinched nerve in her neck. Today's evaluation shows mild left-sided paraspinal tenderness but her significant pain is with palpation to the left sternocleidomastoid. Patient with history and physical consistent with torticollis. Patient will be prescribed Percocet and Robaxin.  She is to followup with [facility].  Concern for pinched nerve Or cord dysfunction at this time as patient's neurologic exam is normal.  I have personally reviewed patient's vitals, nursing note and any pertinent labs or imaging.  I performed an undressed physical exam.    At this time, it has been determined that no acute conditions requiring further emergency intervention. The patient/guardian have been advised of the diagnosis and plan. I reviewed all labs and imaging including any potential incidental findings. We have discussed signs and symptoms that warrant return to the ED, such as weakness, numbness, tingling of the arms or legs.  Patient/guardian has voiced understanding and agreed to follow-up with the PCP or specialist in one week.  Vital signs are stable at discharge.   BP 174/88  Pulse 94  Temp(Src)  99.3 F (37.4 C) (Oral)  Ht 5\' 4"  (1.626 m)  Wt 130 lb (58.968 kg)  BMI 22.30 kg/m2  SpO2 100%        Abigail Butts, PA-C 05/10/14 0206

## 2014-05-10 NOTE — Discharge Instructions (Signed)
1. Medications: percocet, robaxin, usual home medications 2. Treatment: rest, drink plenty of fluids, gentle stretching as discussed 3. Follow Up: Please followup with [facility] as directed.  Torticollis, Acute You have suddenly (acutely) developed a twisted neck (torticollis). This is usually a self-limited condition. CAUSES  Acute torticollis may be caused by malposition, trauma or infection. Most commonly, acute torticollis is caused by sleeping in an awkward position. Torticollis may also be caused by the flexion, extension or twisting of the neck muscles beyond their normal position. Sometimes, the exact cause may not be known. SYMPTOMS  Usually, there is pain and limited movement of the neck. Your neck may twist to one side. DIAGNOSIS  The diagnosis is often made by physical examination. X-rays, CT scans or MRIs may be done if there is a history of trauma or concern of infection. TREATMENT  For a common, stiff neck that develops during sleep, treatment is focused on relaxing the contracted neck muscle. Medications (including shots) may be used to treat the problem. Most cases resolve in several days. Torticollis usually responds to conservative physical therapy. If left untreated, the shortened and spastic neck muscle can cause deformities in the face and neck. Rarely, surgery is required. HOME CARE INSTRUCTIONS   Use over-the-counter and prescription medications as directed by your caregiver.  Do stretching exercises and massage the neck as directed by your caregiver.  Follow up with physical therapy if needed and as directed by your caregiver. SEEK IMMEDIATE MEDICAL CARE IF:   You develop difficulty breathing or noisy breathing (stridor).  You drool, develop trouble swallowing or have pain with swallowing.  You develop numbness or weakness in the hands or feet.  You have changes in speech or vision.  You have problems with urination or bowel movements.  You have  difficulty walking.  You have a fever.  You have increased pain. MAKE SURE YOU:   Understand these instructions.  Will watch your condition.  Will get help right away if you are not doing well or get worse. Document Released: 11/13/2000 Document Revised: 02/08/2012 Document Reviewed: 12/25/2009 [facility] Patient Information 2014 [place], [place].

## 2014-05-11 NOTE — ED Provider Notes (Signed)
Medical screening examination/treatment/procedure(s) were performed by non-physician practitioner and as supervising physician I was immediately available for consultation/collaboration.     Veryl Speak, MD 05/11/14 [phone]

## 2014-06-20 ENCOUNTER — Ambulatory Visit: Payer: No Typology Code available for payment source | Attending: Internal Medicine

## 2014-07-18 ENCOUNTER — Ambulatory Visit: Payer: Self-pay | Admitting: Internal Medicine

## 2014-07-25 ENCOUNTER — Encounter: Payer: Self-pay | Admitting: Internal Medicine

## 2014-07-25 ENCOUNTER — Ambulatory Visit: Payer: No Typology Code available for payment source | Attending: Internal Medicine | Admitting: Internal Medicine

## 2014-07-25 VITALS — BP 144/89 | HR 73 | Temp 98.3°F | Resp 16 | Ht 64.0 in | Wt 146.0 lb

## 2014-07-25 DIAGNOSIS — T754XXA Electrocution, initial encounter: Secondary | ICD-10-CM | POA: Insufficient documentation

## 2014-07-25 DIAGNOSIS — L732 Hidradenitis suppurativa: Secondary | ICD-10-CM | POA: Insufficient documentation

## 2014-07-25 DIAGNOSIS — Z91018 Allergy to other foods: Secondary | ICD-10-CM | POA: Insufficient documentation

## 2014-07-25 DIAGNOSIS — R29898 Other symptoms and signs involving the musculoskeletal system: Secondary | ICD-10-CM | POA: Insufficient documentation

## 2014-07-25 DIAGNOSIS — R569 Unspecified convulsions: Secondary | ICD-10-CM | POA: Insufficient documentation

## 2014-07-25 DIAGNOSIS — F17200 Nicotine dependence, unspecified, uncomplicated: Secondary | ICD-10-CM | POA: Insufficient documentation

## 2014-07-25 DIAGNOSIS — W860XXA Exposure to domestic wiring and appliances, initial encounter: Secondary | ICD-10-CM | POA: Insufficient documentation

## 2014-07-25 DIAGNOSIS — W868XXA Exposure to other electric current, initial encounter: Secondary | ICD-10-CM

## 2014-07-25 DIAGNOSIS — I10 Essential (primary) hypertension: Secondary | ICD-10-CM | POA: Insufficient documentation

## 2014-07-25 MED ORDER — SULFAMETHOXAZOLE-TMP DS 800-160 MG PO TABS: 1.0000 | ORAL_TABLET | Freq: Two times a day (BID) | ORAL | Status: DC
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
## 2014-07-25 NOTE — Patient Instructions (Signed)
Hidradenitis Suppurativa, Sweat Gland Abscess Hidradenitis suppurativa is a long lasting (chronic), uncommon disease of the sweat glands. With this, boil-like lumps and scarring develop in the groin, some times under the arms (axillae), and under the breasts. It may also uncommonly occur behind the ears, in the crease of the buttocks, and around the genitals.  CAUSES  The cause is from a blocking of the sweat glands. They then become infected. It may cause drainage and odor. It is not contagious. So it cannot be given to someone else. It most often shows up in puberty (about 10 to 55 years of age). But it may happen much later. It is similar to acne which is a disease of the sweat glands. This condition is slightly more common in African-Americans and women. SYMPTOMS   Hidradenitis usually starts as one or more red, tender, swellings in the groin or under the arms (axilla).  Over a period of hours to days the lesions get larger. They often open to the skin surface, draining clear to yellow-colored fluid.  The infected area heals with scarring. DIAGNOSIS  Your caregiver makes this diagnosis by looking at you. Sometimes cultures (growing germs on plates in the lab) may be taken. This is to see what germ (bacterium) is causing the infection.  TREATMENT   Topical germ killing medicine applied to the skin (antibiotics) are the treatment of choice. Antibiotics taken by mouth (systemic) are sometimes needed when the condition is getting worse or is severe.  Avoid tight-fitting clothing which traps moisture in.  Dirt does not cause hidradenitis and it is not caused by poor hygiene.  Involved areas should be cleaned daily using an antibacterial soap. Some patients find that the liquid form of Lever 2000, applied to the involved areas as a lotion after bathing, can help reduce the odor related to this condition.  Sometimes surgery is needed to drain infected areas or remove scarred tissue. Removal of  large amounts of tissue is used only in severe cases.  Birth control pills may be helpful.  Oral retinoids (vitamin A derivatives) for 6 to 12 months which are effective for acne may also help this condition.  Weight loss will improve but not cure hidradenitis. It is made worse by being overweight. But the condition is not caused by being overweight.  This condition is more common in people who have had acne.  It may become worse under stress. There is no medical cure for hidradenitis. It can be controlled, but not cured. The condition usually continues for years with periods of getting worse and getting better (remission). Document Released: 06/30/2004 Document Revised: 02/08/2012 Document Reviewed: 02/16/2014 [employer] Patient Information 2015 [employer]. This information is not intended to replace advice given to you by your health care provider. Make sure you discuss any questions you have with your health care provider.  

## 2014-07-26 ENCOUNTER — Telehealth: Payer: Self-pay | Admitting: Emergency Medicine

## 2014-07-26 ENCOUNTER — Telehealth: Payer: Self-pay | Admitting: Internal Medicine

## 2014-07-26 LAB — CK TOTAL AND CKMB (NOT AT ARMC): Total CK: 79 U/L (ref 7–177)

## 2014-07-26 LAB — TROPONIN I

## 2014-07-26 NOTE — Telephone Encounter (Signed)
Patient has come in today requesting a medication for her swollen hand; please f/u with patient about her request; patient was seen yesterday and was not given any medication for this injury

## 2014-07-27 ENCOUNTER — Telehealth: Payer: Self-pay | Admitting: Emergency Medicine

## 2014-07-27 NOTE — Telephone Encounter (Signed)
Message copied by [name] on Fri Jul 27, 2014 11:47 AM ------      Message from: Alison Perez      Created: Thu Jul 26, 2014 11:22 PM       Cardiac test are normal. Explain to patient that because they were negative then I am not worried about any damage to her heart tissue ------

## 2014-07-27 NOTE — Telephone Encounter (Signed)
Left message for pt to call when message received 

## 2014-07-29 ENCOUNTER — Encounter: Payer: Self-pay | Admitting: Internal Medicine

## 2014-07-29 LAB — WOUND CULTURE
GRAM STAIN: NONE SEEN
Gram Stain: NONE SEEN
Gram Stain: NONE SEEN

## 2014-07-30 ENCOUNTER — Telehealth: Payer: Self-pay | Admitting: *Deleted

## 2014-07-30 NOTE — Telephone Encounter (Signed)
Called patient and scheduled her an appointment for 07/31/2014 at 12:15 PM for BP check per Roney Jaffe, NP.

## 2014-07-30 NOTE — Telephone Encounter (Signed)
Message copied by Alverda Skeans on Mon Jul 30, 2014  4:24 PM ------      Message from: Chari Manning A      Created: Sun Jul 29, 2014  9:50 PM      Regarding: nurse visit       Please call this patient and have her come back for a nurse visit to recheck her BP.  I forgot to schedule her a appointment. If it is high on repeat visit inform me please. Thanks ------

## 2014-07-31 ENCOUNTER — Telehealth: Payer: Self-pay | Admitting: *Deleted

## 2014-07-31 ENCOUNTER — Ambulatory Visit: Payer: No Typology Code available for payment source | Attending: Internal Medicine | Admitting: *Deleted

## 2014-07-31 VITALS — BP 127/79 | HR 63 | Resp 16

## 2014-07-31 DIAGNOSIS — T754XXA Electrocution, initial encounter: Secondary | ICD-10-CM
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
## 2014-07-31 NOTE — Telephone Encounter (Signed)
Please send referral to neurology, thanks

## 2014-08-01 NOTE — Telephone Encounter (Signed)
Called to inform patient about Neurology referral. No answer. Left a message for patient to return call. Referral placed per verbal order form Roney Jaffe, NP.

## 2014-08-01 NOTE — Telephone Encounter (Signed)
Pt. Returning call for nurse. Please f/u with pt.

## 2014-08-29 ENCOUNTER — Ambulatory Visit: Payer: Self-pay

## 2014-08-30 ENCOUNTER — Encounter (HOSPITAL_COMMUNITY): Payer: Self-pay | Admitting: Emergency Medicine

## 2014-08-30 ENCOUNTER — Emergency Department (INDEPENDENT_AMBULATORY_CARE_PROVIDER_SITE_OTHER)
Admission: EM | Admit: 2014-08-30 | Discharge: 2014-08-30 | Disposition: A | Discharge status: Home / Self Care | Payer: Self-pay | Source: Home / Self Care | Attending: Emergency Medicine | Admitting: Emergency Medicine

## 2014-08-30 DIAGNOSIS — M6248 Contracture of muscle, other site: Secondary | ICD-10-CM

## 2014-08-30 DIAGNOSIS — M62838 Other muscle spasm: Secondary | ICD-10-CM

## 2014-08-30 MED ORDER — TRAMADOL HCL 50 MG PO TABS: 50.0000 mg | ORAL_TABLET | Freq: Four times a day (QID) | ORAL | Status: DC | PRN

## 2014-08-30 MED ORDER — DIAZEPAM 5 MG PO TABS: 5.0000 mg | ORAL_TABLET | Freq: Three times a day (TID) | ORAL | Status: DC | PRN

## 2014-08-30 NOTE — ED Provider Notes (Signed)
CSN: [id number]     Arrival date & time 08/30/14  1435 History   First MD Initiated Contact with Patient 08/30/14 1529     Chief Complaint  Patient presents with  . Neck Pain   (Consider location/radiation/quality/duration/timing/severity/associated sxs/prior Treatment) HPI    53 year old female presents for evaluation of muscle spasm in the left side of her neck. This initially started 2 days ago. She has tight muscles and shooting pain with any movement of her head and neck. Fingers from the side of her neck down to the top of her left shoulder. She has had this before, she was prescribed oxycodone and Robaxin which helped a little bit and eventually got better. She denies any injury. No numbness or weakness in her hands. No pain in the midline of her neck. No headache, blurry vision, weakness, chest pain, shortness of breath.  Past Medical History  Diagnosis Date  . Fibroid   . Hidradenitis   . Hypertension   . Seizures     " MILD "   Past Surgical History  Procedure Laterality Date  . Uterine fibroid surgery    . Perineal hidradenitis excision    . Mouth surgery     History reviewed. No pertinent family history. History  Substance Use Topics  . Smoking status: Current Every Day Smoker -- 0.25 packs/day for 40 years    Types: Cigarettes  . Smokeless tobacco: Never Used  . Alcohol Use: Yes     Comment: occasional   OB History   Grav Para Term Preterm Abortions TAB SAB Ect Mult Living                 Review of Systems  Musculoskeletal: Positive for neck pain and neck stiffness.  All other systems reviewed and are negative.   Allergies  Apple; Artichoke; and Peach  Home Medications   Prior to Admission medications   Medication Sig Start Date End Date Taking? Authorizing Provider  acetaminophen (TYLENOL) 500 MG tablet Take 1 tablet (500 mg total) by mouth every 6 (six) hours as needed for pain. 09/04/13   [name], MD  diazepam (VALIUM) 5 MG tablet Take 1  tablet (5 mg total) by mouth every 8 (eight) hours as needed for anxiety. 08/30/14   Liam Graham, PA-C  methocarbamol (ROBAXIN) 500 MG tablet Take 1 tablet (500 mg total) by mouth 2 (two) times daily. 05/10/14   Hannah Muthersbaugh, PA-C  naproxen sodium (ANAPROX) 220 MG tablet Take 220 mg by mouth daily as needed (for pain).    Historical Provider, MD  neomycin-bacitracin-polymyxin (NEOSPORIN) ointment Apply 1 application topically daily as needed (for cut).    Historical Provider, MD  oxyCODONE (OXY IR/ROXICODONE) 5 MG immediate release tablet Take 1 tablet (5 mg total) by mouth every 4 (four) hours as needed for pain. 09/04/13   [name], MD  oxyCODONE-acetaminophen (PERCOCET/ROXICET) 5-325 MG per tablet Take 1 tablet by mouth every 4 (four) hours as needed for severe pain. May take 2 tablets PO q 6 hours for severe pain - Do not take with Tylenol as this tablet already contains tylenol 05/10/14   Jarrett Soho Muthersbaugh, PA-C  sulfamethoxazole-trimethoprim (BACTRIM DS) 800-160 MG per tablet Take 1 tablet by mouth 2 (two) times daily. 07/25/14   Lance Bosch, NP  traMADol (ULTRAM) 50 MG tablet Take 1 tablet (50 mg total) by mouth every 6 (six) hours as needed. 08/30/14   Freeman Caldron Keeleigh Terris, PA-C   BP 146/94  Pulse 88  Temp(Src) 98.8 F (37.1 C) (Oral)  Resp 14  SpO2 100% Physical Exam  Nursing note and vitals reviewed. Constitutional: She is oriented to person, place, and time. Vital signs are normal. She appears well-developed and well-nourished. No distress.  HENT:  Head: Normocephalic and atraumatic.  Neck: Trachea normal. Muscular tenderness (left) present. No spinous process tenderness present. No rigidity. Decreased range of motion present. No edema present. No Brudzinski's sign and no Kernig's sign noted.  Pulmonary/Chest: Effort normal. No respiratory distress.  Neurological: She is alert and oriented to person, place, and time. She has normal strength. Coordination normal.  Skin:  Skin is warm and dry. No rash noted. She is not diaphoretic.  Psychiatric: She has a normal mood and affect. Judgment normal.    ED Course  Procedures (including critical care time) Labs Review Labs Reviewed - No data to display  Imaging Review No results found.   MDM   1. Muscle spasms of neck    Treat with Ultram and diazepam. Followup if no improvement in a few days.   Meds ordered this encounter  Medications  . diazepam (VALIUM) 5 MG tablet    Sig: Take 1 tablet (5 mg total) by mouth every 8 (eight) hours as needed for anxiety.    Dispense:  12 tablet    Refill:  0    Order Specific Question:  Supervising Provider    Answer:  Jake Michaelis, DAVID C [id number]  . traMADol (ULTRAM) 50 MG tablet    Sig: Take 1 tablet (50 mg total) by mouth every 6 (six) hours as needed.    Dispense:  15 tablet    Refill:  0    Order Specific Question:  Supervising Provider    Answer:  Jake Michaelis, DAVID C [[id number]]       Liam Graham, PA-C 08/30/14 1556

## 2014-08-30 NOTE — ED Provider Notes (Signed)
Medical screening examination/treatment/procedure(s) were performed by non-physician practitioner and as supervising physician I was immediately available for consultation/collaboration.  Philipp Deputy, M.D.  Harden Mo, MD 08/30/14 2226

## 2014-08-30 NOTE — ED Notes (Signed)
Pt reports  Neck  Pain        As  Well  As in  [place]  She  described  The  Pain as   More  Of  A  Spasm      She    denys  Any  Particular  Injury          Symptoms          X  2   Days

## 2014-08-30 NOTE — Discharge Instructions (Signed)
Muscle Cramps and Spasms Muscle cramps and spasms are when muscles tighten by themselves. They usually get better within minutes. Muscle cramps are painful. They are usually stronger and last longer than muscle spasms. Muscle spasms may or may not be painful. They can last a few seconds or much longer. HOME CARE  Drink enough fluid to keep your pee (urine) clear or pale yellow.  Massage, stretch, and relax the muscle.  Use a warm towel, heating pad, or warm shower water on tight muscles.  Place ice on the muscle if it is tender or in pain.  Put ice in a plastic bag.  Place a towel between your skin and the bag.  Leave the ice on for 15-20 minutes, 03-04 times a day.  Only take medicine as told by your doctor. GET HELP RIGHT AWAY IF:  Your cramps or spasms get worse, happen more often, or do not get better with time. MAKE SURE YOU:  Understand these instructions.  Will watch your condition.  Will get help right away if you are not doing well or get worse. Document Released: 10/29/2008 Document Revised: 03/13/2013 Document Reviewed: 11/02/2012 [facility] Patient Information 2015 [place], [place]. This information is not intended to replace advice given to you by your health care provider. Make sure you discuss any questions you have with your health care provider.  Heat Therapy Heat therapy can help make painful, stiff muscles and joints feel better. Do not use heat on new injuries. Wait at least 48 hours after an injury to use heat. Do not use heat when you have aches or pains right after an activity. If you still have pain 3 hours after stopping the activity, then you may use heat. HOME CARE Wet heat pack  Soak a clean towel in warm water. Squeeze out the extra water.  Put the warm, wet towel in a plastic bag.  Place a thin, dry towel between your skin and the bag.  Put the heat pack on the area for 5 minutes, and check your skin. Your skin may be pink, but it should not be  red.  Leave the heat pack on the area for 15 to 30 minutes.  Repeat this every 2 to 4 hours while awake. Do not use heat while you are sleeping. Warm water bath  Fill a tub with warm water.  Place the affected body part in the tub.  Soak the area for 20 to 40 minutes.  Repeat as needed. Hot water bottle  Fill the water bottle half full with hot water.  Press out the extra air. Close the cap tightly.  Place a dry towel between your skin and the bottle.  Put the bottle on the area for 5 minutes, and check your skin. Your skin may be pink, but it should not be red.  Leave the bottle on the area for 15 to 30 minutes.  Repeat this every 2 to 4 hours while awake. Electric heating pad  Place a dry towel between your skin and the heating pad.  Set the heating pad on low heat.  Put the heating pad on the area for 10 minutes, and check your skin. Your skin may be pink, but it should not be red.  Leave the heating pad on the area for 20 to 40 minutes.  Repeat this every 2 to 4 hours while awake.  Do not lie on the heating pad.  Do not fall asleep while using the heating pad.  Do not use the  heating pad near water. GET HELP RIGHT AWAY IF:  You get blisters or red skin.  Your skin is puffy (swollen), or you lose feeling (numbness) in the affected area.  You have any new problems.  Your problems are getting worse.  You have any questions or concerns. If you have any problems, stop using heat therapy until you see your doctor. MAKE SURE YOU:  Understand these instructions.  Will watch your condition.  Will get help right away if you are not doing well or get worse. Document Released: 02/08/2012 Document Reviewed: 01/09/2014 [facility] Patient Information 2015 [place]. This information is not intended to replace advice given to you by your health care provider. Make sure you discuss any questions you have with your health care provider.

## 2014-09-11 ENCOUNTER — Ambulatory Visit: Payer: Self-pay | Admitting: Neurology

## 2014-09-19 ENCOUNTER — Ambulatory Visit: Payer: Self-pay

## 2014-09-25 NOTE — Telephone Encounter (Signed)
Alverda Skeans, RN at 08/01/2014 11:47 AM     Status: Signed        Called to inform patient about Neurology referral. No answer. Left a message for patient to return call. Referral placed per verbal order form Roney Jaffe, NP.

## 2014-10-19 ENCOUNTER — Telehealth: Payer: Self-pay | Admitting: Neurology

## 2014-10-19 NOTE — Telephone Encounter (Signed)
Pt r/s her NP appt w/ DR. Patel on 10/23/14. Dr. Cheyenne Adas provider was notified.

## 2014-10-23 ENCOUNTER — Ambulatory Visit: Payer: Self-pay | Admitting: Neurology

## 2014-12-20 ENCOUNTER — Telehealth: Payer: Self-pay | Admitting: Neurology

## 2014-12-20 NOTE — Telephone Encounter (Signed)
Pt's appt for NP appt w/ Dr. Posey Pronto was canceled on 12/21/14 due to the bad weather we were expecting.  Pt did not want to r/s. Will call later to r/s. . DR. Keck/referring was notified.

## 2014-12-21 ENCOUNTER — Ambulatory Visit: Payer: Self-pay | Admitting: Neurology

## 2016-05-14 ENCOUNTER — Ambulatory Visit (HOSPITAL_COMMUNITY)
Admission: EM | Admit: 2016-05-14 | Discharge: 2016-05-14 | Disposition: A | Discharge status: Home / Self Care | Payer: Self-pay | Attending: Emergency Medicine | Admitting: Emergency Medicine

## 2016-05-14 ENCOUNTER — Encounter (HOSPITAL_COMMUNITY): Payer: Self-pay | Admitting: Emergency Medicine

## 2016-05-14 DIAGNOSIS — M6248 Contracture of muscle, other site: Secondary | ICD-10-CM

## 2016-05-14 DIAGNOSIS — M62838 Other muscle spasm: Secondary | ICD-10-CM

## 2016-05-14 DIAGNOSIS — M25511 Pain in right shoulder: Secondary | ICD-10-CM

## 2016-05-14 MED ORDER — IBUPROFEN 800 MG PO TABS: 800.0000 mg | ORAL_TABLET | Freq: Three times a day (TID) | ORAL | Status: DC | PRN

## 2016-05-14 MED ORDER — METAXALONE 800 MG PO TABS: 800.0000 mg | ORAL_TABLET | Freq: Three times a day (TID) | ORAL | Status: DC

## 2016-05-14 MED ORDER — HYDROCODONE-ACETAMINOPHEN 5-325 MG PO TABS
2.0000 | ORAL_TABLET | ORAL | Status: DC | PRN
Start: 2016-05-14 — End: 2016-09-05
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
## 2016-05-14 NOTE — Discharge Instructions (Signed)
Take the medication as written. Take 1 gram of tylenol with the motrin up to 3 times a day as needed for pain and fever. This is an effective combination for pain. Take the hydrocodone/norco only for severe pain. Do not take the tylenol and norco as they both have tylenol in them and too much can hurt your liver. Do not exceed 4 grams of tylenol a day from all sources. sleep with a pillow underneath your body and continue the neck pillow and back support.   Go to www.goodrx.com to look up your medications. This will give you a list of where you can find your prescriptions at the most affordable prices.

## 2016-08-02 ENCOUNTER — Ambulatory Visit (HOSPITAL_COMMUNITY)
Admission: EM | Admit: 2016-08-02 | Discharge: 2016-08-02 | Disposition: A | Discharge status: Home / Self Care | Payer: Self-pay | Attending: Physician Assistant | Admitting: Physician Assistant

## 2016-08-02 ENCOUNTER — Encounter (HOSPITAL_COMMUNITY): Payer: Self-pay | Admitting: Emergency Medicine

## 2016-08-02 DIAGNOSIS — L309 Dermatitis, unspecified: Secondary | ICD-10-CM

## 2016-08-02 MED ORDER — CEPHALEXIN 500 MG PO CAPS: 500.0000 mg | ORAL_CAPSULE | Freq: Three times a day (TID) | ORAL | 0 refills | Status: DC

## 2016-08-02 NOTE — ED Triage Notes (Signed)
The patient presented to the [facility] with a complaint of a wound on her right foot x 1 month that started as a blister and never healed.

## 2016-08-02 NOTE — Discharge Instructions (Signed)
SOAK IN EPSOM SALT WATER  LEAVE OPEN TO AIR WHEN POSSIBLE  GENTLY REMOVE DEAD SKIN FROM AREA.   TAKE MEDICATION AS DIRECTED.

## 2016-08-26 ENCOUNTER — Encounter (HOSPITAL_COMMUNITY): Payer: Self-pay | Admitting: *Deleted

## 2016-08-26 ENCOUNTER — Emergency Department (HOSPITAL_COMMUNITY)
Admission: EM | Admit: 2016-08-26 | Discharge: 2016-08-26 | Disposition: A | Discharge status: Home / Self Care | Payer: Self-pay | Attending: Emergency Medicine | Admitting: Emergency Medicine

## 2016-08-26 DIAGNOSIS — M79674 Pain in right toe(s): Secondary | ICD-10-CM | POA: Insufficient documentation

## 2016-08-26 DIAGNOSIS — Z791 Long term (current) use of non-steroidal anti-inflammatories (NSAID): Secondary | ICD-10-CM | POA: Insufficient documentation

## 2016-08-26 DIAGNOSIS — Z79899 Other long term (current) drug therapy: Secondary | ICD-10-CM | POA: Insufficient documentation

## 2016-08-26 DIAGNOSIS — Z792 Long term (current) use of antibiotics: Secondary | ICD-10-CM | POA: Insufficient documentation

## 2016-08-26 DIAGNOSIS — I10 Essential (primary) hypertension: Secondary | ICD-10-CM | POA: Insufficient documentation

## 2016-08-26 DIAGNOSIS — F17210 Nicotine dependence, cigarettes, uncomplicated: Secondary | ICD-10-CM | POA: Insufficient documentation

## 2016-08-26 MED ORDER — NAPROXEN 500 MG PO TABS: 500.0000 mg | ORAL_TABLET | Freq: Two times a day (BID) | ORAL | 0 refills | Status: DC

## 2016-08-26 MED ORDER — NAPROXEN 500 MG PO TABS
500.0000 mg | ORAL_TABLET | Freq: Once | ORAL | Status: AC
  Administered 2016-08-26: 500 mg via ORAL
  Filled 2016-08-26: qty 1

## 2016-08-26 NOTE — ED Notes (Signed)
Discharge instructions and follow up care reviewed with patient. Patient verbalized understanding. 

## 2016-08-26 NOTE — ED Triage Notes (Signed)
Patient presents with right foot pain, bruising and swelling x2 days.  Patient denies trauma to foot, but stated, "It feels like I dropped something on it."  Patient denies medical history.  +2 pulse in RLE.  Patient states she had blister on bottom of right foot several weeks ago and was treated at [facility] with abx.

## 2016-08-26 NOTE — ED Provider Notes (Signed)
[facility] DEPT Provider Note   CSN: [id number] Arrival date & time: 08/26/16  1545  By signing my name below, I, Alison Perez, attest that this documentation has been prepared under the direction and in the presence of non-physician practitioner, Janetta Hora, PA-C. Electronically Signed: Jeanell Perez, Scribe. 08/26/2016. 5:21 PM.  History   Chief Complaint Chief Complaint  Patient presents with  . Foot Swelling    right   The history is provided by the patient and medical records. No language interpreter was used.   HPI Comments: Alison Perez is a 55 y.o. female who presents to the Emergency Department complaining of constant moderate right foot pain that started 3 days ago. Reports associated swelling to right foot. Movement of big toe exacerbates the pain. She was treated for right foot infection on 08/02/16 and prescribed keflex 500mg  three times daily which has gotten better. Denies any hx of gout although she states the same problem occurred in the great toe of her left foot "a while ago". She denies excessive alcohol use, shellfish consumption, or blood pressure medication use.   Past Medical History:  Diagnosis Date  . Fibroid   . Hidradenitis   . Hypertension   . Seizures ([place])    " MILD "    Patient Active Problem List   Diagnosis Date Noted  . Left shoulder pain 09/04/2013    Past Surgical History:  Procedure Laterality Date  . MOUTH SURGERY    . PERINEAL HIDRADENITIS EXCISION    . UTERINE FIBROID SURGERY      OB History    No data available       Home Medications    Prior to Admission medications   Medication Sig Start Date End Date Taking? Authorizing Provider  acetaminophen (TYLENOL) 500 MG tablet Take 1 tablet (500 mg total) by mouth every 6 (six) hours as needed for pain. 09/04/13   [name], MD  cephALEXin (KEFLEX) 500 MG capsule Take 1 capsule (500 mg total) by mouth 3 (three) times daily. 08/02/16   [name], PA    HYDROcodone-acetaminophen (NORCO/VICODIN) 5-325 MG tablet Take 2 tablets by mouth every 4 (four) hours as needed for moderate pain. 05/14/16   Melynda Ripple, MD  ibuprofen (ADVIL,MOTRIN) 800 MG tablet Take 1 tablet (800 mg total) by mouth every 8 (eight) hours as needed. 05/14/16   Melynda Ripple, MD  metaxalone (SKELAXIN) 800 MG tablet Take 1 tablet (800 mg total) by mouth 3 (three) times daily. 05/14/16   Melynda Ripple, MD  naproxen (NAPROSYN) 500 MG tablet Take 1 tablet (500 mg total) by mouth 2 (two) times daily. 08/26/16   Recardo Evangelist, PA-C  neomycin-bacitracin-polymyxin (NEOSPORIN) ointment Apply 1 application topically daily as needed (for cut).    Historical Provider, MD    Family History No family history on file.  Social History Social History  Substance Use Topics  . Smoking status: Current Every Day Smoker    Packs/day: 0.25    Years: 40.00    Types: Cigarettes  . Smokeless tobacco: Never Used  . Alcohol use Yes     Comment: occasional     Allergies   Apple; Artichoke [cynara scolymus (artichoke)]; and Peach [prunus persica]   Review of Systems Review of Systems  Constitutional: Negative for fever.  Musculoskeletal: Positive for joint swelling.     Physical Exam Updated Vital Signs BP 121/82 (BP Location: Right Arm)   Pulse 94   Temp 98.5 F (36.9 C) (Oral)  Resp 18   SpO2 100%   Physical Exam  Constitutional: She is oriented to person, place, and time. She appears well-developed and well-nourished. No distress.  HENT:  Head: Normocephalic and atraumatic.  Eyes: Conjunctivae are normal. Pupils are equal, round, and reactive to light. Right eye exhibits no discharge. Left eye exhibits no discharge. No scleral icterus.  Neck: Normal range of motion. Neck supple.  Cardiovascular: Normal rate and regular rhythm.   No murmur heard. Pulmonary/Chest: Effort normal and breath sounds normal. No respiratory distress.  Abdominal: Soft. She exhibits no  distension. There is no tenderness.  Musculoskeletal: She exhibits no edema.  Right foot: There is obvious swelling, redness, and warmth of the medial aspect of right foot and great toe. No deformity. Significant tenderness to palpation with ROM of great toe. No pain with ROM of smaller toes. N/V intact.   Neurological: She is alert and oriented to person, place, and time.  Skin: Skin is warm and dry.  Psychiatric: She has a normal mood and affect. Her behavior is normal.  Nursing note and vitals reviewed.    ED Treatments / Results  DIAGNOSTIC STUDIES: Oxygen Saturation is 100% on RA, normal by my interpretation.    COORDINATION OF CARE: 5:25 PM- Pt advised of plan for treatment and pt agrees.  Labs (all labs ordered are listed, but only abnormal results are displayed) Labs Reviewed - No data to display  EKG  EKG Interpretation None       Radiology No results found.  Procedures Procedures (including critical care time)  Medications Ordered in ED Medications  naproxen (NAPROSYN) tablet 500 mg (500 mg Oral Given 08/26/16 1735)     Initial Impression / Assessment and Plan / ED Course  I have reviewed the triage vital signs and the nursing notes.  Pertinent labs & imaging results that were available during my care of the patient were reviewed by me and considered in my medical decision making (see chart for details).  Clinical Course   55 year old female presents with symptoms consistent with gout. Patient is afebrile, not tachycardic or tachypneic, normotensive, and not hypoxic. She has a hx of the same symptoms in the left great toe as well. Will treat with course of NSAIDs. Imaging not indicated as there has been no acute injury. Patient is NAD, non-toxic, with stable VS. Patient is informed of clinical course, understands medical decision making process, and agrees with plan. Opportunity for questions provided and all questions answered. Return precautions  given.   Final Clinical Impressions(s) / ED Diagnoses   Final diagnoses:  Great toe pain, right    New Prescriptions New Prescriptions   NAPROXEN (NAPROSYN) 500 MG TABLET    Take 1 tablet (500 mg total) by mouth 2 (two) times daily.   I personally performed the services described in this documentation, which was scribed in my presence. The recorded information has been reviewed and is accurate.     Recardo Evangelist, PA-C 08/26/16 2115    Sherwood Gambler, MD 08/31/16 [phone]

## 2016-08-26 NOTE — Discharge Instructions (Signed)
Take Naproxen 500mg  twice a day until the pain, redness, and swelling gets better. Take this medication with food if possible. Please see your family doctor if symptoms are not improving.

## 2016-09-05 ENCOUNTER — Emergency Department (HOSPITAL_COMMUNITY): Payer: No Typology Code available for payment source

## 2016-09-05 ENCOUNTER — Emergency Department (HOSPITAL_COMMUNITY)
Admission: EM | Admit: 2016-09-05 | Discharge: 2016-09-05 | Disposition: A | Discharge status: Home / Self Care | Payer: No Typology Code available for payment source | Attending: Emergency Medicine | Admitting: Emergency Medicine

## 2016-09-05 ENCOUNTER — Encounter (HOSPITAL_COMMUNITY): Payer: Self-pay | Admitting: Emergency Medicine

## 2016-09-05 DIAGNOSIS — S8992XA Unspecified injury of left lower leg, initial encounter: Secondary | ICD-10-CM | POA: Diagnosis not present

## 2016-09-05 DIAGNOSIS — M25512 Pain in left shoulder: Secondary | ICD-10-CM | POA: Insufficient documentation

## 2016-09-05 DIAGNOSIS — Y92410 Unspecified street and highway as the place of occurrence of the external cause: Secondary | ICD-10-CM | POA: Insufficient documentation

## 2016-09-05 DIAGNOSIS — M25511 Pain in right shoulder: Secondary | ICD-10-CM | POA: Diagnosis not present

## 2016-09-05 DIAGNOSIS — Y999 Unspecified external cause status: Secondary | ICD-10-CM | POA: Diagnosis not present

## 2016-09-05 DIAGNOSIS — M25562 Pain in left knee: Secondary | ICD-10-CM

## 2016-09-05 DIAGNOSIS — Y939 Activity, unspecified: Secondary | ICD-10-CM | POA: Diagnosis not present

## 2016-09-05 DIAGNOSIS — I10 Essential (primary) hypertension: Secondary | ICD-10-CM | POA: Insufficient documentation

## 2016-09-05 DIAGNOSIS — F17210 Nicotine dependence, cigarettes, uncomplicated: Secondary | ICD-10-CM | POA: Diagnosis not present

## 2016-09-05 MED ORDER — IBUPROFEN 600 MG PO TABS: 600.0000 mg | ORAL_TABLET | Freq: Three times a day (TID) | ORAL | 0 refills | Status: DC

## 2016-09-05 MED ORDER — HYDROCODONE-ACETAMINOPHEN 5-325 MG PO TABS: 2.0000 | ORAL_TABLET | ORAL | 0 refills | Status: DC | PRN

## 2016-09-05 MED ORDER — HYDROCODONE-ACETAMINOPHEN 5-325 MG PO TABS
1.0000 | ORAL_TABLET | Freq: Once | ORAL | Status: AC
  Administered 2016-09-05: 1 via ORAL
  Filled 2016-09-05: qty 1

## 2016-09-05 MED ORDER — IBUPROFEN 200 MG PO TABS
600.0000 mg | ORAL_TABLET | Freq: Once | ORAL | Status: AC
  Administered 2016-09-05: 600 mg via ORAL
  Filled 2016-09-05: qty 3

## 2016-09-05 MED ORDER — CYCLOBENZAPRINE HCL 10 MG PO TABS: 10.0000 mg | ORAL_TABLET | Freq: Every day | ORAL | 0 refills | Status: DC
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
## 2016-09-05 NOTE — ED Notes (Signed)
PA at the bedside.
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
## 2016-12-14 ENCOUNTER — Inpatient Hospital Stay (HOSPITAL_COMMUNITY): Payer: BLUE CROSS/BLUE SHIELD

## 2016-12-14 ENCOUNTER — Encounter (HOSPITAL_COMMUNITY): Payer: Self-pay | Admitting: *Deleted

## 2016-12-14 ENCOUNTER — Inpatient Hospital Stay (HOSPITAL_COMMUNITY)
Admission: AD | Admit: 2016-12-14 | Discharge: 2016-12-14 | Disposition: A | Discharge status: Other Acute Inpatient Hospital | Payer: BLUE CROSS/BLUE SHIELD | Source: Ambulatory Visit | Attending: Emergency Medicine | Admitting: Emergency Medicine

## 2016-12-14 DIAGNOSIS — F17210 Nicotine dependence, cigarettes, uncomplicated: Secondary | ICD-10-CM | POA: Insufficient documentation

## 2016-12-14 DIAGNOSIS — I10 Essential (primary) hypertension: Secondary | ICD-10-CM | POA: Diagnosis not present

## 2016-12-14 DIAGNOSIS — R0602 Shortness of breath: Secondary | ICD-10-CM | POA: Diagnosis not present

## 2016-12-14 DIAGNOSIS — R0789 Other chest pain: Secondary | ICD-10-CM | POA: Insufficient documentation

## 2016-12-14 DIAGNOSIS — M79661 Pain in right lower leg: Secondary | ICD-10-CM | POA: Insufficient documentation

## 2016-12-14 DIAGNOSIS — R079 Chest pain, unspecified: Secondary | ICD-10-CM | POA: Diagnosis present

## 2016-12-14 DIAGNOSIS — M79662 Pain in left lower leg: Secondary | ICD-10-CM | POA: Diagnosis present

## 2016-12-14 DIAGNOSIS — Z79899 Other long term (current) drug therapy: Secondary | ICD-10-CM | POA: Diagnosis not present

## 2016-12-14 LAB — URINALYSIS, ROUTINE W REFLEX MICROSCOPIC
Bilirubin Urine: NEGATIVE
GLUCOSE, UA: NEGATIVE mg/dL
Hgb urine dipstick: NEGATIVE
Ketones, ur: NEGATIVE mg/dL
Leukocytes, UA: NEGATIVE
Nitrite: NEGATIVE
PH: 7 (ref 5.0–8.0)
PROTEIN: NEGATIVE mg/dL
Specific Gravity, Urine: 1.01 (ref 1.005–1.030)

## 2016-12-14 LAB — CBC
HEMATOCRIT: 34 % — AB (ref 36.0–46.0)
HEMOGLOBIN: 11.3 g/dL — AB (ref 12.0–15.0)
MCH: 27.1 pg (ref 26.0–34.0)
MCHC: 33.2 g/dL (ref 30.0–36.0)
MCV: 81.5 fL (ref 78.0–100.0)
Platelets: 265 10*3/uL (ref 150–400)
RBC: 4.17 MIL/uL (ref 3.87–5.11)
RDW: 14.3 % (ref 11.5–15.5)
WBC: 8.1 10*3/uL (ref 4.0–10.5)

## 2016-12-14 LAB — BASIC METABOLIC PANEL
Anion gap: 6 (ref 5–15)
BUN: 10 mg/dL (ref 6–20)
CHLORIDE: 104 mmol/L (ref 101–111)
CO2: 29 mmol/L (ref 22–32)
CREATININE: 0.64 mg/dL (ref 0.44–1.00)
Calcium: 9.7 mg/dL (ref 8.9–10.3)
GFR calc Af Amer: >60 mL/min (ref >60)
GFR calc non Af Amer: >60 mL/min (ref >60)
GLUCOSE: 92 mg/dL (ref 65–99)
Potassium: 4 mmol/L (ref 3.5–5.1)
Sodium: 139 mmol/L (ref 135–145)

## 2016-12-14 LAB — TROPONIN I: Troponin I: <0.03 ng/mL (ref <0.03)

## 2016-12-14 LAB — BRAIN NATRIURETIC PEPTIDE: B NATRIURETIC PEPTIDE 5: 38.9 pg/mL (ref 0.0–100.0)

## 2016-12-14 MED ORDER — SODIUM CHLORIDE 0.9 % IV SOLN
INTRAVENOUS | Status: DC
  Administered 2016-12-14: 14:00:00 via INTRAVENOUS

## 2016-12-14 MED ORDER — HYDROCODONE-ACETAMINOPHEN 5-325 MG PO TABS
1.0000 | ORAL_TABLET | Freq: Once | ORAL | Status: AC
  Administered 2016-12-14: 1 via ORAL
  Filled 2016-12-14: qty 1

## 2016-12-14 MED ORDER — ASPIRIN 81 MG PO CHEW
324.0000 mg | CHEWABLE_TABLET | Freq: Once | ORAL | Status: AC
Start: 2016-12-14 — End: 2016-12-14
  Administered 2016-12-14: 324 mg via ORAL
  Filled 2016-12-14: qty 4

## 2016-12-14 MED ORDER — HYDROCODONE-ACETAMINOPHEN 5-325 MG PO TABS: 1.0000 | ORAL_TABLET | Freq: Four times a day (QID) | ORAL | 0 refills | Status: DC | PRN
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
## 2016-12-14 NOTE — MAU Provider Note (Signed)
[facility] Provider Note   CSN: [id number] Arrival date & time: 12/14/16  1224     History   Chief Complaint Chief Complaint  Patient presents with  . Leg Pain  . Chest Pain    HPI Alison Perez is a 56 y.o. female who presents to the MAU with chest pain that started 2 days ago but got much worse last night and has continued today. She has some shortness of breath and complains of bilateral lower extremity pain and weakness. She also complains of decreased strength in her left hand. Patient denies any previous cardiac problems. She complains of nausea. Patient is an every day smoker.   The history is provided by the patient. No language interpreter was used.  Leg Pain   The incident occurred more than 1 week ago.  Chest Pain  The chest pain began 2 days ago. Chest pain occurs constantly. The chest pain is worsening. The pain is associated with coughing, breathing and exertion. At its most intense, the chest pain is at 10/10. The chest pain is currently at 10/10. The quality of the pain is described as heavy and pressure-like. The pain does not radiate. Chest pain is worsened by exertion. Primary symptoms include shortness of breath and nausea. Pertinent negatives for primary symptoms include no fever, no cough, no wheezing, no palpitations, no abdominal pain and no vomiting.  Associated symptoms include weakness (left arm). She tried nothing for the symptoms. Risk factors include smoking/tobacco exposure.  Her past medical history is significant for anxiety/panic attacks. Seizures: hx of.     Past Medical History:  Diagnosis Date  . Fibroid   . Hidradenitis   . Hypertension   . Seizures ([place])    " MILD "    Patient Active Problem List   Diagnosis Date Noted  . Left shoulder pain 09/04/2013    Past Surgical History:  Procedure Laterality Date  . MOUTH SURGERY    . PERINEAL HIDRADENITIS EXCISION    . UTERINE FIBROID SURGERY      OB History    Gravida Para  Term Preterm AB Living   3 3 2 1        SAB TAB Ectopic Multiple Live Births                   Home Medications    Prior to Admission medications   Medication Sig Start Date End Date Taking? Authorizing Provider  acetaminophen (TYLENOL) 500 MG tablet Take 1 tablet (500 mg total) by mouth every 6 (six) hours as needed for pain. 09/04/13   [name], MD  cephALEXin (KEFLEX) 500 MG capsule Take 1 capsule (500 mg total) by mouth 3 (three) times daily. 08/02/16   [name], PA  cyclobenzaprine (FLEXERIL) 10 MG tablet Take 1 tablet (10 mg total) by mouth at bedtime. 09/05/16   Recardo Evangelist, PA-C  HYDROcodone-acetaminophen (NORCO/VICODIN) 5-325 MG tablet Take 2 tablets by mouth every 4 (four) hours as needed. 09/05/16   Recardo Evangelist, PA-C  ibuprofen (ADVIL,MOTRIN) 600 MG tablet Take 1 tablet (600 mg total) by mouth 3 (three) times daily. 09/05/16   Recardo Evangelist, PA-C  metaxalone (SKELAXIN) 800 MG tablet Take 1 tablet (800 mg total) by mouth 3 (three) times daily. 05/14/16   Melynda Ripple, MD  naproxen (NAPROSYN) 500 MG tablet Take 1 tablet (500 mg total) by mouth 2 (two) times daily. 08/26/16   Recardo Evangelist, PA-C  neomycin-bacitracin-polymyxin (NEOSPORIN) ointment Apply 1 application  topically daily as needed (for cut).    Historical Provider, MD    Family History History reviewed. No pertinent family history.  Social History Social History  Substance Use Topics  . Smoking status: Current Every Day Smoker    Packs/day: 0.25    Years: 40.00    Types: Cigarettes  . Smokeless tobacco: Never Used  . Alcohol use Yes     Comment: occasional     Allergies   Apple; Artichoke [cynara scolymus (artichoke)]; and Peach [prunus persica]   Review of Systems Review of Systems  Constitutional: Negative for chills and fever.  HENT: Negative.   Respiratory: Positive for chest tightness and shortness of breath. Negative for cough and wheezing.   Cardiovascular:  Positive for chest pain. Negative for palpitations and leg swelling.  Gastrointestinal: Positive for nausea. Negative for abdominal pain and vomiting.  Musculoskeletal: Negative for neck stiffness.  Skin: Negative for wound.  Neurological: Positive for weakness (left arm). Negative for syncope, facial asymmetry and speech difficulty. Seizures: hx of.  Psychiatric/Behavioral: Negative for confusion.     Physical Exam Updated Vital Signs BP 130/90 (BP Location: Right Arm)   Pulse 76   Temp 98.4 F (36.9 C) (Oral)   Resp 18   Ht 5\' 4"  (1.626 m)   Wt 143 lb (64.9 kg)   SpO2 100%   BMI 24.55 kg/m   Physical Exam  Constitutional: She is oriented to person, place, and time. She appears well-developed and well-nourished. No distress.  HENT:  Head: Normocephalic and atraumatic.  Eyes: EOM are normal.  Neck: Trachea normal. Neck supple. Normal carotid pulses and no JVD present. Carotid bruit is not present.  Cardiovascular: Normal rate, regular rhythm and intact distal pulses.   No murmur heard. Pulmonary/Chest: Effort normal. No respiratory distress. She has no wheezes. She has no rales. She exhibits tenderness.  Tender with palpation of the mid chest.   Abdominal: Soft. Bowel sounds are normal. There is no tenderness.  Musculoskeletal: She exhibits no edema.  Radial pulses 2+, adequate circulation, grips strong and equal bilateral.  Bilateral knee pain with palpation, full passive range of motion without pain. Pedal pulses 2+, no calf tenderness.   Neurological: She is alert and oriented to person, place, and time. She has normal strength. No cranial nerve deficit or sensory deficit. Gait normal.  Skin: Skin is warm and dry.  Psychiatric: She has a normal mood and affect. Her behavior is normal. Thought content normal.  Nursing note and vitals reviewed.    ED Treatments / Results  Labs (all labs ordered are listed, but only abnormal results are displayed) Labs Reviewed  CBC    BASIC METABOLIC PANEL  URINALYSIS, ROUTINE W REFLEX MICROSCOPIC  TROPONIN I  BRAIN NATRIURETIC PEPTIDE   EKG Normal   Radiology No results found.  Procedures Procedures (including critical care time)  Medications Ordered in ED Medications  0.9 %  sodium chloride infusion (not administered)  aspirin chewable tablet 324 mg (not administered)     Initial Impression / Assessment and Plan / ED Course  I have reviewed the triage vital signs and the nursing notes.  Pertinent labs & imaging results that were available during my care of the patient were reviewed by me and considered in my medical decision making (see chart for details).  Clinical Course   56 y.o. female with chest pain, shortness of breath and feeling of weakness stable for transport to [facility] ED for further evaluation.  Discussed with Dr. Venora Maples  at [facility] and he will accept the patient for transfer.   Final Clinical Impressions(s) / ED Diagnoses   Final diagnoses:  Other chest pain    New Prescriptions Current Discharge Medication List

## 2016-12-14 NOTE — MAU Note (Signed)
Ok per provider to come off cardiac monitoring for chest xray.
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
## 2016-12-14 NOTE — MAU Note (Addendum)
@  1233 continuous cardiac monitoring on zoll machine started; 2L O2 [place] applied. sats 95-96%ra; 100% on [place]. IV attempted.

## 2016-12-14 NOTE — MAU Note (Signed)
[facility] arrived.

## 2017-01-05 NOTE — ED Provider Notes (Signed)
[facility] DEPT Provider Note   CSN: [id number] Arrival date & time: 12/14/16  1224     History   Chief Complaint Chief Complaint  Patient presents with  . Leg Pain  . Chest Pain    HPI Alison Perez is a 56 y.o. female.  HPI Patient is transferred to [facility] where she initially presented complaining of bilateral knee and chest pain is been constant and ongoing for 2 days.  Patient reports intermittent chest pain over the past year but reports the last several days of the more severe.  She is brought to the emergency department via EMS and was given nitroglycerin and aspirin.  She reports ongoing discomfort and pain is worsened movement and palpation of her anterior chest.  No history of cardiac disease.  She does have a history of hypertension.  No family history of significant coronary artery disease.  No productive cough.  No fevers or chills.  Symptoms are mild to moderate in severity.   Past Medical History:  Diagnosis Date  . Fibroid   . Hidradenitis   . Hypertension   . Seizures ([place])    " MILD "    Patient Active Problem List   Diagnosis Date Noted  . Left shoulder pain 09/04/2013    Past Surgical History:  Procedure Laterality Date  . MOUTH SURGERY    . PERINEAL HIDRADENITIS EXCISION    . UTERINE FIBROID SURGERY      OB History    Gravida Para Term Preterm AB Living   3 3 2 1        SAB TAB Ectopic Multiple Live Births                   Home Medications    Prior to Admission medications   Medication Sig Start Date End Date Taking? Authorizing Provider  doxycycline (ADOXA) 100 MG tablet Take 100 mg by mouth 2 (two) times daily. Pt started on 12/11/16 to take for 14 days.   Yes Historical Provider, MD  ibuprofen (ADVIL,MOTRIN) 600 MG tablet Take 1 tablet (600 mg total) by mouth 3 (three) times daily. 09/05/16  Yes Recardo Evangelist, PA-C  neomycin-bacitracin-polymyxin (NEOSPORIN) ointment Apply 1 application topically daily as needed (for  cut).   Yes Historical Provider, MD  triamcinolone (KENALOG) 0.025 % ointment Apply 1 application topically 2 (two) times daily.   Yes Historical Provider, MD  acetaminophen (TYLENOL) 500 MG tablet Take 1 tablet (500 mg total) by mouth every 6 (six) hours as needed for pain. Patient not taking: Reported on 12/14/2016 09/04/13   [name], MD  cephALEXin (KEFLEX) 500 MG capsule Take 1 capsule (500 mg total) by mouth 3 (three) times daily. Patient not taking: Reported on 12/14/2016 08/02/16   [name], PA  cyclobenzaprine (FLEXERIL) 10 MG tablet Take 1 tablet (10 mg total) by mouth at bedtime. 09/05/16   Recardo Evangelist, PA-C  HYDROcodone-acetaminophen (NORCO/VICODIN) 5-325 MG tablet Take 1 tablet by mouth every 6 (six) hours as needed for moderate pain. 12/14/16   [name], MD  metaxalone (SKELAXIN) 800 MG tablet Take 1 tablet (800 mg total) by mouth 3 (three) times daily. Patient not taking: Reported on 12/14/2016 05/14/16   Melynda Ripple, MD  naproxen (NAPROSYN) 500 MG tablet Take 1 tablet (500 mg total) by mouth 2 (two) times daily. Patient not taking: Reported on 12/14/2016 08/26/16   Recardo Evangelist, PA-C    Family History History reviewed. No pertinent family history.  Social  History Social History  Substance Use Topics  . Smoking status: Current Every Day Smoker    Packs/day: 0.25    Years: 40.00    Types: Cigarettes  . Smokeless tobacco: Never Used  . Alcohol use Yes     Comment: occasional     Allergies   Apple; Artichoke [cynara scolymus (artichoke)]; and Peach [prunus persica]   Review of Systems Review of Systems  All other systems reviewed and are negative.    Physical Exam Updated Vital Signs BP 133/77   Pulse 62   Temp 98.2 F (36.8 C) (Oral)   Resp 15   Ht 5\' 4"  (1.626 m)   Wt 143 lb (64.9 kg)   SpO2 100%   BMI 24.55 kg/m   Physical Exam  Constitutional: She is oriented to person, place, and time. She appears well-developed and  well-nourished. No distress.  HENT:  Head: Normocephalic and atraumatic.  Eyes: EOM are normal.  Neck: Normal range of motion.  Cardiovascular: Normal rate, regular rhythm and normal heart sounds.   Pulmonary/Chest: Effort normal and breath sounds normal.  Abdominal: Soft. She exhibits no distension. There is no tenderness.  Musculoskeletal: Normal range of motion.  Neurological: She is alert and oriented to person, place, and time.  Skin: Skin is warm and dry.  Psychiatric: She has a normal mood and affect. Judgment normal.  Nursing note and vitals reviewed.    ED Treatments / Results  Labs (all labs ordered are listed, but only abnormal results are displayed) Labs Reviewed  CBC - Abnormal; Notable for the following:       Result Value   Hemoglobin 11.3 (*)    HCT 34.0 (*)    All other components within normal limits  URINALYSIS, ROUTINE W REFLEX MICROSCOPIC - Abnormal; Notable for the following:    Color, Urine STRAW (*)    All other components within normal limits  BASIC METABOLIC PANEL  TROPONIN I  BRAIN NATRIURETIC PEPTIDE    EKG  EKG Interpretation  Date/Time:  Monday December 14 2016 14:49:51 EST Ventricular Rate:  63 PR Interval:  150 QRS Duration: 98 QT Interval:  408 QTC Calculation: 417 R Axis:   79 Text Interpretation:  Normal sinus rhythm Normal ECG Confirmed by COOK  MD, BRIAN ([id number]) on 12/15/2016 4:22:22 PM       Radiology No results found.  Procedures Procedures (including critical care time)  Medications Ordered in ED Medications  aspirin chewable tablet 324 mg (324 mg Oral Given 12/14/16 1344)  HYDROcodone-acetaminophen (NORCO/VICODIN) 5-325 MG per tablet 1 tablet (1 tablet Oral Given 12/14/16 1704)     Initial Impression / Assessment and Plan / ED Course  I have reviewed the triage vital signs and the nursing notes.  Pertinent labs & imaging results that were available during my care of the patient were reviewed by me and considered in  my medical decision making (see chart for details).       Final Clinical Impressions(s) / ED Diagnoses   Final diagnoses:  Other chest pain  Chest pain, unspecified type  Pain in right lower leg    New Prescriptions Discharge Medication List as of 12/14/2016  4:51 PM       [name], MD 01/05/17 [phone]

## 2017-01-27 ENCOUNTER — Encounter: Payer: Self-pay | Admitting: Neurology

## 2017-01-27 ENCOUNTER — Ambulatory Visit (INDEPENDENT_AMBULATORY_CARE_PROVIDER_SITE_OTHER): Payer: BLUE CROSS/BLUE SHIELD | Admitting: Neurology

## 2017-01-27 ENCOUNTER — Ambulatory Visit: Payer: BLUE CROSS/BLUE SHIELD | Admitting: Neurology

## 2017-01-27 DIAGNOSIS — M79604 Pain in right leg: Secondary | ICD-10-CM

## 2017-01-27 DIAGNOSIS — G8929 Other chronic pain: Secondary | ICD-10-CM

## 2017-01-27 DIAGNOSIS — M25512 Pain in left shoulder: Secondary | ICD-10-CM
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
## 2017-01-27 NOTE — Progress Notes (Signed)
Please refer to EMG and nerve conduction study procedure note. 

## 2019-02-26 ENCOUNTER — Other Ambulatory Visit: Payer: Self-pay

## 2019-02-26 ENCOUNTER — Encounter (HOSPITAL_COMMUNITY): Payer: Self-pay

## 2019-02-26 ENCOUNTER — Ambulatory Visit (HOSPITAL_COMMUNITY)
Admission: EM | Admit: 2019-02-26 | Discharge: 2019-02-26 | Disposition: A | Discharge status: Home / Self Care | Payer: BLUE CROSS/BLUE SHIELD | Attending: Family Medicine | Admitting: Family Medicine

## 2019-02-26 DIAGNOSIS — K0889 Other specified disorders of teeth and supporting structures: Secondary | ICD-10-CM

## 2019-02-26 DIAGNOSIS — K047 Periapical abscess without sinus: Secondary | ICD-10-CM

## 2019-02-26 MED ORDER — HYDROCODONE-ACETAMINOPHEN 7.5-325 MG PO TABS: 1.0000 | ORAL_TABLET | Freq: Four times a day (QID) | ORAL | 0 refills | Status: DC | PRN

## 2019-02-26 MED ORDER — PENICILLIN V POTASSIUM 500 MG PO TABS: 500.0000 mg | ORAL_TABLET | Freq: Four times a day (QID) | ORAL | 0 refills | Status: AC

## 2019-02-26 NOTE — ED Provider Notes (Signed)
[facility]    CSN: [id number] Arrival date & time: 02/26/19  1328     History   Chief Complaint Chief Complaint  Patient presents with  . Dental Pain    HPI Alison Perez is a 58 y.o. female.   HPI Patient is here for dental pain.  She has broken and teeth that need care.  For the last couple of days she has had trouble on the right side, upper jaw with 1 of her molars.  Is loose.  It is painful.  She cannot chew on the side.  Today she has a low-grade temperature.  Swelling in her face.  Denies trauma.  She does not have a usual dentist.  She will be given a referral Past Medical History:  Diagnosis Date  . Fibroid   . Hidradenitis   . Hypertension   . Seizures ([place])    " MILD "    Patient Active Problem List   Diagnosis Date Noted  . Left shoulder pain 09/04/2013    Past Surgical History:  Procedure Laterality Date  . MOUTH SURGERY    . PERINEAL HIDRADENITIS EXCISION    . UTERINE FIBROID SURGERY      OB History    Gravida  3   Para  3   Term  2   Preterm  1   AB      Living        SAB      TAB      Ectopic      Multiple      Live Births               Home Medications    Prior to Admission medications   Medication Sig Start Date End Date Taking? Authorizing Provider  HYDROcodone-acetaminophen (NORCO) 7.5-325 MG tablet Take 1 tablet by mouth every 6 (six) hours as needed for moderate pain. 02/26/19   Raylene Everts, MD  penicillin v potassium (VEETID) 500 MG tablet Take 1 tablet (500 mg total) by mouth 4 (four) times daily for 10 days. 02/26/19 03/08/19  Raylene Everts, MD  triamcinolone (KENALOG) 0.025 % ointment Apply 1 application topically 2 (two) times daily.    [provider]    Family History History reviewed. No pertinent family history.  Social History Social History   Tobacco Use  . Smoking status: Current Every Day Smoker    Packs/day: 0.25    Years: 40.00    Pack years: 10.00    Types:  Cigarettes  . Smokeless tobacco: Never Used  Substance Use Topics  . Alcohol use: Yes    Comment: occasional  . Drug use: No     Allergies   Apple; Artichoke [cynara scolymus (artichoke)]; and Peach [prunus persica]   Review of Systems Review of Systems  Constitutional: Negative for chills and fever.  HENT: Positive for dental problem. Negative for ear pain and sore throat.   Eyes: Negative for pain and visual disturbance.  Respiratory: Negative for cough and shortness of breath.   Cardiovascular: Negative for chest pain and palpitations.  Gastrointestinal: Negative for abdominal pain and vomiting.  Genitourinary: Negative for dysuria and hematuria.  Musculoskeletal: Negative for arthralgias and back pain.  Skin: Negative for color change and rash.  Neurological: Negative for seizures and syncope.  All other systems reviewed and are negative.    Physical Exam Triage Vital Signs ED Triage Vitals  Enc Vitals Group     BP 02/26/19 1520 (!) 156/95  Pulse Rate 02/26/19 1520 89     Resp 02/26/19 1520 16     Temp 02/26/19 1520 99.9 F (37.7 C)     Temp Source 02/26/19 1520 Oral     SpO2 02/26/19 1520 100 %     Weight 02/26/19 1519 135 lb (61.2 kg)     Height --      Head Circumference --      Peak Flow --      Pain Score 02/26/19 1519 10     Pain Loc --      Pain Edu? --      Excl. in [place]? --    No data found.  Updated Vital Signs BP (!) 156/95 (BP Location: Right Arm)   Pulse 89   Temp 99.9 F (37.7 C) (Oral)   Resp 16   Wt 61.2 kg   LMP 02/26/2019   SpO2 100%   BMI 23.17 kg/m      Physical Exam Constitutional:      General: She is not in acute distress.    Appearance: She is well-developed.  HENT:     Head: Normocephalic and atraumatic.      Right Ear: Tympanic membrane and ear canal normal.     Left Ear: Tympanic membrane and ear canal normal.     Nose: Nose normal.     Mouth/Throat:     Mouth: Mucous membranes are moist.      Comments:  Condition of dentition in general very poor Eyes:     Conjunctiva/sclera: Conjunctivae normal.     Pupils: Pupils are equal, round, and reactive to light.  Neck:     Musculoskeletal: Normal range of motion.  Cardiovascular:     Rate and Rhythm: Normal rate.  Pulmonary:     Effort: Pulmonary effort is normal. No respiratory distress.  Abdominal:     General: There is no distension.     Palpations: Abdomen is soft.  Musculoskeletal: Normal range of motion.  Skin:    General: Skin is warm and dry.  Neurological:     Mental Status: She is alert.      UC Treatments / Results  Labs (all labs ordered are listed, but only abnormal results are displayed) Labs Reviewed - No data to display  EKG None  Radiology No results found.  Procedures Procedures (including critical care time)  Medications Ordered in UC Medications - No data to display  Initial Impression / Assessment and Plan / UC Course  I have reviewed the triage vital signs and the nursing notes.  Pertinent labs & imaging results that were available during my care of the patient were reviewed by me and considered in my medical decision making (see chart for details).     Final Clinical Impressions(s) / UC Diagnoses   Final diagnoses:  Pain, dental  Dental infection     Discharge Instructions     Take the antibiotic 4 times a day as directed Take Advil or Aleve for moderate pain Take hydrocodone as needed for severe pain Do not drive while taking hydrocodone.  Please take with food Call a dentist tomorrow to arrange follow-up   ED Prescriptions    Medication Sig Dispense Auth. Provider   penicillin v potassium (VEETID) 500 MG tablet Take 1 tablet (500 mg total) by mouth 4 (four) times daily for 10 days. 40 tablet Raylene Everts, MD   HYDROcodone-acetaminophen [facility]) 7.5-325 MG tablet Take 1 tablet by mouth every 6 (six) hours as needed for  moderate pain. 15 tablet Raylene Everts, MD      Controlled Substance Prescriptions [place] Controlled Substance Registry consulted? Yes, I have consulted the [place] Controlled Substances Registry for this patient, and feel the risk/benefit ratio today is favorable for proceeding with this prescription for a controlled substance.   Raylene Everts, MD 02/26/19 1556

## 2019-02-26 NOTE — Discharge Instructions (Addendum)
Take the antibiotic 4 times a day as directed Take Advil or Aleve for moderate pain Take hydrocodone as needed for severe pain Do not drive while taking hydrocodone.  Please take with food Call a dentist tomorrow to arrange follow-up

## 2019-02-26 NOTE — ED Triage Notes (Signed)
Pt cc she has a toothache and swelling pt states her face started swelling up today. Pt states she tried aleve.

## 2019-03-31 DIAGNOSIS — R569 Unspecified convulsions: Secondary | ICD-10-CM

## 2019-03-31 HISTORY — DX: Unspecified convulsions: R56.9

## 2020-02-13 ENCOUNTER — Encounter: Payer: Self-pay | Admitting: Family Medicine

## 2020-02-13 ENCOUNTER — Ambulatory Visit (INDEPENDENT_AMBULATORY_CARE_PROVIDER_SITE_OTHER): Payer: Self-pay | Admitting: Family Medicine

## 2020-02-13 ENCOUNTER — Other Ambulatory Visit: Payer: Self-pay

## 2020-02-13 VITALS — BP 116/62 | HR 91 | Ht 64.0 in | Wt 149.8 lb

## 2020-02-13 DIAGNOSIS — F17209 Nicotine dependence, unspecified, with unspecified nicotine-induced disorders: Secondary | ICD-10-CM

## 2020-02-13 DIAGNOSIS — G35 Multiple sclerosis: Secondary | ICD-10-CM

## 2020-02-13 DIAGNOSIS — Z7689 Persons encountering health services in other specified circumstances: Secondary | ICD-10-CM | POA: Insufficient documentation

## 2020-02-13 DIAGNOSIS — I10 Essential (primary) hypertension: Secondary | ICD-10-CM | POA: Insufficient documentation

## 2020-02-13 LAB — POCT GLYCOSYLATED HEMOGLOBIN (HGB A1C): Hemoglobin A1C: 5.7 % — AB (ref 4.0–5.6)

## 2020-02-13 NOTE — Patient Instructions (Addendum)
It was a pleasure meeting you today  You were seen in clinic to establish care.  I have ordered blood work for you today.  I will call you if the results come back abnormal.  I encourage you to quit smoking.  Please use the I QUIT line to help you with this process.  I have booked you an appointment with me April 17 at 145pm for PAP.  Please arrive 89mins before appointment to check in.   Smoking Tobacco Information, Adult Smoking tobacco can be harmful to your health. Tobacco contains a poisonous (toxic), colorless chemical called nicotine. Nicotine is addictive. It changes the brain and can make it hard to stop smoking. Tobacco also has other toxic chemicals that can hurt your body and raise your risk of many cancers. How can smoking tobacco affect me? Smoking tobacco puts you at risk for:  Cancer. Smoking is most commonly associated with lung cancer, but can also lead to cancer in other parts of the body.  Chronic obstructive pulmonary disease (COPD). This is a long-term lung condition that makes it hard to breathe. It also gets worse over time.  High blood pressure (hypertension), heart disease, stroke, or heart attack.  Lung infections, such as pneumonia.  Cataracts. This is when the lenses in the eyes become clouded.  Digestive problems. This may include peptic ulcers, heartburn, and gastroesophageal reflux disease (GERD).  Oral health problems, such as gum disease and tooth loss.  Loss of taste and smell. Smoking can affect your appearance by causing:  Wrinkles.  Yellow or stained teeth, fingers, and fingernails. Smoking tobacco can also affect your social life, because:  It may be challenging to find places to smoke when away from home. Many workplaces, [employer], hotels, and public places are tobacco-free.  Smoking is expensive. This is due to the cost of tobacco and the long-term costs of treating health problems from smoking.  Secondhand smoke may affect those  around you. Secondhand smoke can cause lung cancer, breathing problems, and heart disease. Children of smokers have a higher risk for: ? Sudden infant death syndrome (SIDS). ? Ear infections. ? Lung infections. If you currently smoke tobacco, quitting now can help you:  Lead a longer and healthier life.  Look, smell, breathe, and feel better over time.  Save money.  Protect others from the harms of secondhand smoke. What actions can I take to prevent health problems? Quit smoking   Do not start smoking. Quit if you already do.  Make a plan to quit smoking and commit to it. Look for programs to help you and ask your health care provider for recommendations and ideas.  Set a date and write down all the reasons you want to quit.  Let your friends and family know you are quitting so they can help and support you. Consider finding friends who also want to quit. It can be easier to quit with someone else, so that you can support each other.  Talk with your health care provider about using nicotine replacement medicines to help you quit, such as gum, lozenges, patches, sprays, or pills.  Do not replace cigarette smoking with electronic cigarettes, which are commonly called e-cigarettes. The safety of e-cigarettes is not known, and some may contain harmful chemicals.  If you try to quit but return to smoking, stay positive. It is common to slip up when you first quit, so take it one day at a time.  Be prepared for cravings. When you feel the urge to  smoke, chew gum or suck on hard candy. Lifestyle  Stay busy and take care of your body.  Drink enough fluid to keep your urine pale yellow.  Get plenty of exercise and eat a healthy diet. This can help prevent weight gain after quitting.  Monitor your eating habits. Quitting smoking can cause you to have a larger appetite than when you smoke.  Find ways to relax. Go out with friends or family to a movie or a restaurant where people do  not smoke.  Ask your health care provider about having regular tests (screenings) to check for cancer. This may include blood tests, imaging tests, and other tests.  Find ways to manage your stress, such as meditation, yoga, or exercise. Where to find support To get support to quit smoking, consider:  Asking your health care provider for more information and resources.  Taking classes to learn more about quitting smoking.  Looking for local organizations that offer resources about quitting smoking.  Joining a support group for people who want to quit smoking in your local community.  Calling the smokefree.gov counselor helpline: 1-800-Quit-Now 818-337-5634) Where to find more information You may find more information about quitting smoking from:  HelpGuide.org: www.helpguide.org  https://hall.com/: smokefree.gov  American Lung Association: www.lung.org Contact a health care provider if you:  Have problems breathing.  Notice that your lips, nose, or fingers turn blue.  Have chest pain.  Are coughing up blood.  Feel faint or you pass out.  Have other health changes that cause you to worry. Summary  Smoking tobacco can negatively affect your health, the health of those around you, your finances, and your social life.  Do not start smoking. Quit if you already do. If you need help quitting, ask your health care provider.  Think about joining a support group for people who want to quit smoking in your local community. There are many effective programs that will help you to quit this behavior. This information is not intended to replace advice given to you by your health care provider. Make sure you discuss any questions you have with your health care provider. Document Revised: 08/11/2019 Document Reviewed: 12/01/2016 Elsevier Patient Education  2020 [employer].

## 2020-02-13 NOTE — Assessment & Plan Note (Addendum)
Pleasant 59 y.o female here to establish care.  She reports having history of MS followed by Rheumatology nad Glaucoma followed by Ophthalmology, appointments in May -orthostatics vital signs lying 119/86 P 83, sitting 121/76 P 86, standing 108/74 P 89 -CBC, TSH, BMet, HbA1c -Appointment booked for April 17 for PAP  -Next visit will need to discuss preventative screening -Continue to encourage smoking cessation, I-QUIT card given to help with cessation -Patient to bring in all medications at next visit

## 2020-02-13 NOTE — Progress Notes (Signed)
    SUBJECTIVE:   CHIEF COMPLAINT / HPI:  Establish care  Patient here to establish care.  She reports no acute concerns.  Does endorse dizziness and hot spells since she was in her early 20's.  She states that this happens sporadically and is associated with passing out.. She states that she is unsure how long she is out for and that there have been witness that endorse maybe 2-3 mins without any unusual body movements. She states that when she awakens she vomits and feels much better.     PERTINENT  PMH / PSH:  Med: Multiple Sclerosis, Glaucoma, Borderline DM [id number] hysterectomy 45yrs ago,  Abdominal Hernia  Medications: Humira weekly, Latanoprost each eye nightly NKA Tobacco use ETOH occasionally at social events    Next appointment with Rheumatology and Ophthalmology in May.     OBJECTIVE:   BP 116/62   Pulse 91   Ht 5\' 4"  (1.626 m)   Wt 149 lb 12.8 oz (67.9 kg)   LMP 02/26/2019   SpO2 98%   BMI 25.71 kg/m   General: Alert and oriented, no apparent distress  Eyes: PEERLA ENTM: No pharyengeal erythema Neck: nontender Cardiovascular: RRR with no murmurs noted Respiratory: CTA bilaterally  Gastrointestinal: Bowel sounds present. No abdominal pain MSK: Upper extremity strength 5/5 bilaterally, Lower extremity strength 5/5 bilaterally  Neuro: CN III-XII intact, motor and sensation intact Psych: Behavior and speech appropriate to situation  ASSESSMENT/PLAN:   Establishing care with new doctor, encounter for Pleasant 59 y.o female here to establish care.  She reports having history of MS followed by Rheumatology nad Glaucoma followed by Ophthalmology, appointments in May -orthostatics vital signs lying 119/86 P 83, sitting 121/76 P 86, standing 108/74 P 89 -CBC, TSH, BMet, HbA1c -Appointment booked for April 17 for PAP  -Next visit will need to discuss preventative screening -Continue to encourage smoking cessation, I-QUIT card given to help with cessation -Patient  to bring in all medications at next visit     Carollee Leitz, MD [place]

## 2020-02-14 LAB — CBC WITH DIFFERENTIAL/PLATELET
Basophils Absolute: 0 10*3/uL (ref 0.0–0.2)
Basos: 1 %
EOS (ABSOLUTE): 0.2 10*3/uL (ref 0.0–0.4)
Eos: 3 %
Hematocrit: 37.7 % (ref 34.0–46.6)
Hemoglobin: 12 g/dL (ref 11.1–15.9)
Immature Grans (Abs): 0 10*3/uL (ref 0.0–0.1)
Immature Granulocytes: 0 %
Lymphocytes Absolute: 4.1 10*3/uL — ABNORMAL HIGH (ref 0.7–3.1)
Lymphs: 52 %
MCH: 27.8 pg (ref 26.6–33.0)
MCHC: 31.8 g/dL (ref 31.5–35.7)
MCV: 87 fL (ref 79–97)
Monocytes Absolute: 0.5 10*3/uL (ref 0.1–0.9)
Monocytes: 6 %
Neutrophils Absolute: 2.9 10*3/uL (ref 1.4–7.0)
Neutrophils: 38 %
Platelets: 188 10*3/uL (ref 150–450)
RBC: 4.32 x10E6/uL (ref 3.77–5.28)
RDW: 13.1 % (ref 11.7–15.4)
WBC: 7.7 10*3/uL (ref 3.4–10.8)

## 2020-02-14 LAB — LIPID PANEL
Chol/HDL Ratio: 4.9 ratio — ABNORMAL HIGH (ref 0.0–4.4)
Cholesterol, Total: 230 mg/dL — ABNORMAL HIGH (ref 100–199)
HDL: 47 mg/dL (ref >39)
LDL Chol Calc (NIH): 168 mg/dL — ABNORMAL HIGH (ref 0–99)
Triglycerides: 87 mg/dL (ref 0–149)
VLDL Cholesterol Cal: 15 mg/dL (ref 5–40)

## 2020-02-14 LAB — BASIC METABOLIC PANEL
BUN/Creatinine Ratio: 25 (ref 12–28)
BUN: 19 mg/dL (ref 8–27)
CO2: 24 mmol/L (ref 20–29)
Calcium: 10 mg/dL (ref 8.7–10.3)
Chloride: 104 mmol/L (ref 96–106)
Creatinine, Ser: 0.77 mg/dL (ref 0.57–1.00)
GFR calc Af Amer: 96 mL/min/{1.73_m2} (ref >59)
GFR calc non Af Amer: 84 mL/min/{1.73_m2} (ref >59)
Glucose: 92 mg/dL (ref 65–99)
Potassium: 4.3 mmol/L (ref 3.5–5.2)
Sodium: 140 mmol/L (ref 134–144)

## 2020-02-14 LAB — TSH: TSH: 0.497 u[IU]/mL (ref 0.450–4.500)

## 2020-03-11 ENCOUNTER — Ambulatory Visit (HOSPITAL_COMMUNITY)
Admission: EM | Admit: 2020-03-11 | Discharge: 2020-03-11 | Disposition: A | Discharge status: Home / Self Care | Payer: 59 | Attending: Family Medicine | Admitting: Family Medicine

## 2020-03-11 ENCOUNTER — Other Ambulatory Visit: Payer: Self-pay

## 2020-03-11 ENCOUNTER — Encounter (HOSPITAL_COMMUNITY): Payer: Self-pay

## 2020-03-11 DIAGNOSIS — J029 Acute pharyngitis, unspecified: Secondary | ICD-10-CM | POA: Diagnosis not present

## 2020-03-11 HISTORY — DX: Type 2 diabetes mellitus without complications: E11.9

## 2020-03-11 MED ORDER — PENICILLIN G BENZATHINE 1200000 UNIT/2ML IM SUSP
1.2000 10*6.[IU] | Freq: Once | INTRAMUSCULAR | Status: AC
  Administered 2020-03-11: 11:00:00 1.2 10*6.[IU] via INTRAMUSCULAR

## 2020-03-11 MED ORDER — PENICILLIN G BENZATHINE 1200000 UNIT/2ML IM SUSP
INTRAMUSCULAR | Status: AC
  Filled 2020-03-11: qty 2

## 2020-03-11 NOTE — ED Triage Notes (Signed)
Pt presents with complaints of sore throat that started on Wednesday. Pts voice is muffled.

## 2020-03-11 NOTE — Discharge Instructions (Signed)
We gave you an injection of penicillin to treat your tonsillitis Continue tylenol and ibuprofen for pain and swelling Drink plenty of fluids Follow up if not seeing any improvement or worsening over the next 24-48 hours

## 2020-03-11 NOTE — ED Provider Notes (Signed)
[facility]    CSN: [id number] Arrival date & time: 03/11/20  0846      History   Chief Complaint Chief Complaint  Patient presents with  . Sore Throat    HPI Alison Perez is a 59 y.o. female history of hypertension, DM type II, presenting today for evaluation of a sore throat.  Patient notes that she began to develop a sore throat on Wednesday, symptoms slightly improved over the weekend, but over the past 24 hours significantly worsened again.  Reports recurrent history of this in the past and typically receives a penicillin injection.  She feels symptoms bilaterally, but worse on the right side.  Painful to swallow.  Denies shortness of breath.  Denies rhinorrhea or cough.  Denies fevers.  HPI  Past Medical History:  Diagnosis Date  . Diabetes mellitus without complication ([place])   . Fibroid   . Hidradenitis   . Hypertension   . Seizures ([place])    " MILD "    Patient Active Problem List   Diagnosis Date Noted  . Multiple sclerosis ([place]) 02/13/2020  . Establishing care with new doctor, encounter for 02/13/2020    Past Surgical History:  Procedure Laterality Date  . MOUTH SURGERY    . PERINEAL HIDRADENITIS EXCISION    . UTERINE FIBROID SURGERY      OB History    Gravida  3   Para  3   Term  2   Preterm  1   AB      Living        SAB      TAB      Ectopic      Multiple      Live Births               Home Medications    Prior to Admission medications   Medication Sig Start Date End Date Taking? Authorizing Provider  insulin lispro (HUMALOG) 100 UNIT/ML injection Inject into the skin 3 (three) times daily before meals.   Yes [provider]  triamcinolone (KENALOG) 0.025 % ointment Apply 1 application topically 2 (two) times daily.    [provider]    Family History Family History  Problem Relation Age of Onset  . Healthy Mother   . Healthy Father     Social History Social History   Tobacco Use   . Smoking status: Current Every Day Smoker    Packs/day: 0.25    Years: 40.00    Pack years: 10.00    Types: Cigarettes  . Smokeless tobacco: Never Used  Substance Use Topics  . Alcohol use: Yes    Comment: occasional  . Drug use: No     Allergies   Apple, Artichoke [cynara scolymus (artichoke)], and Peach [prunus persica]   Review of Systems Review of Systems  Constitutional: Negative for activity change, appetite change, chills, fatigue and fever.  HENT: Positive for sore throat and trouble swallowing. Negative for congestion, ear pain, rhinorrhea and sinus pressure.   Eyes: Negative for discharge and redness.  Respiratory: Negative for cough, chest tightness and shortness of breath.   Cardiovascular: Negative for chest pain.  Gastrointestinal: Negative for abdominal pain, diarrhea, nausea and vomiting.  Musculoskeletal: Negative for myalgias.  Skin: Negative for rash.  Neurological: Negative for dizziness, light-headedness and headaches.     Physical Exam Triage Vital Signs ED Triage Vitals  Enc Vitals Group     BP 03/11/20 1027 139/83     Pulse Rate  03/11/20 1027 84     Resp 03/11/20 1027 19     Temp 03/11/20 1027 98.7 F (37.1 C)     Temp src --      SpO2 03/11/20 1027 99 %     Weight --      Height --      Head Circumference --      Peak Flow --      Pain Score 03/11/20 1025 10     Pain Loc --      Pain Edu? --      Excl. in [place]? --    No data found.  Updated Vital Signs BP 139/83   Pulse 84   Temp 98.7 F (37.1 C)   Resp 19   LMP 02/26/2019   SpO2 99%   Visual Acuity Right Eye Distance:   Left Eye Distance:   Bilateral Distance:    Right Eye Near:   Left Eye Near:    Bilateral Near:     Physical Exam Vitals and nursing note reviewed.  Constitutional:      Appearance: She is well-developed.     Comments: No acute distress  HENT:     Head: Normocephalic and atraumatic.     Ears:     Comments: Bilateral ears without tenderness to  palpation of external auricle, tragus and mastoid, EAC's without erythema or swelling, TM's with good bony landmarks and cone of light. Non erythematous.     Nose: Nose normal.     Mouth/Throat:     Comments: Bilateral tonsils significantly enlarged with erythema, no exudate, Uvula midline without swelling, no soft palate swelling, posterior pharynx minimally visualize due to swelling of tonsils Voice muffled Eyes:     Conjunctiva/sclera: Conjunctivae normal.  Cardiovascular:     Rate and Rhythm: Normal rate.  Pulmonary:     Effort: Pulmonary effort is normal. No respiratory distress.     Comments: Breathing comfortably at rest, CTABL, no wheezing, rales or other adventitious sounds auscultated Abdominal:     General: There is no distension.  Musculoskeletal:        General: Normal range of motion.     Cervical back: Neck supple.  Skin:    General: Skin is warm and dry.  Neurological:     Mental Status: She is alert and oriented to person, place, and time.      UC Treatments / Results  Labs (all labs ordered are listed, but only abnormal results are displayed) Labs Reviewed - No data to display  EKG   Radiology No results found.  Procedures Procedures (including critical care time)  Medications Ordered in UC Medications  penicillin g benzathine (BICILLIN LA) 1200000 UNIT/2ML injection 1.2 Million Units (has no administration in time range)    Initial Impression / Assessment and Plan / UC Course  I have reviewed the triage vital signs and the nursing notes.  Pertinent labs & imaging results that were available during my care of the patient were reviewed by me and considered in my medical decision making (see chart for details).     Patient with acute pharyngitis/tonsillitis.  Initiating on antibiotics based on appearance, deferring strep testing.  Given amount of swelling and per patient request will provide Bicillin injection today.  Continue symptomatic and  supportive care.Discussed strict return precautions. Patient verbalized understanding and is agreeable with plan.  Final Clinical Impressions(s) / UC Diagnoses   Final diagnoses:  Acute pharyngitis, unspecified etiology     Discharge Instructions  We gave you an injection of penicillin to treat your tonsillitis Continue tylenol and ibuprofen for pain and swelling Drink plenty of fluids Follow up if not seeing any improvement or worsening over the next 24-48 hours   ED Prescriptions    None     PDMP not reviewed this encounter.   Janith Lima, PA-C 03/11/20 1044

## 2020-03-14 ENCOUNTER — Other Ambulatory Visit: Payer: Self-pay

## 2020-03-14 ENCOUNTER — Ambulatory Visit (INDEPENDENT_AMBULATORY_CARE_PROVIDER_SITE_OTHER): Payer: 59 | Admitting: Family Medicine

## 2020-03-14 ENCOUNTER — Encounter: Payer: Self-pay | Admitting: Gastroenterology

## 2020-03-14 ENCOUNTER — Other Ambulatory Visit (HOSPITAL_COMMUNITY)
Admission: RE | Admit: 2020-03-14 | Discharge: 2020-03-14 | Disposition: A | Discharge status: Home / Self Care | Payer: 59 | Source: Ambulatory Visit | Attending: Family Medicine | Admitting: Family Medicine

## 2020-03-14 ENCOUNTER — Encounter: Payer: Self-pay | Admitting: Family Medicine

## 2020-03-14 VITALS — BP 100/60 | HR 96 | Wt 147.0 lb

## 2020-03-14 DIAGNOSIS — Z1239 Encounter for other screening for malignant neoplasm of breast: Secondary | ICD-10-CM

## 2020-03-14 DIAGNOSIS — Z124 Encounter for screening for malignant neoplasm of cervix: Secondary | ICD-10-CM | POA: Diagnosis present

## 2020-03-14 DIAGNOSIS — Z1211 Encounter for screening for malignant neoplasm of colon: Secondary | ICD-10-CM | POA: Diagnosis not present

## 2020-03-14 LAB — POCT WET PREP (WET MOUNT)
Clue Cells Wet Prep Whiff POC: POSITIVE
Trichomonas Wet Prep HPF POC: ABSENT

## 2020-03-14 NOTE — Progress Notes (Signed)
    SUBJECTIVE:   CHIEF COMPLAINT / HPI:  Here for annual PAP    Patient reports recently entered sexual relationship reports having unprotected sex.  She denies any vaginal discharge or itching. Unsure when last pap completed.  She reports having had a partial hysterectomy for DUB for which she think she still has her cervix intact.    PERTINENT  PMH / PSH: Post Menopausal  Partial Hysterectomy 1998 Multiple Sclerosis   Screening up to date HbA1c-5.7 PAP Lipid Panel  OBJECTIVE:   BP 100/60   Pulse 96   Wt 147 lb (66.7 kg)   LMP 02/26/2019   SpO2 98%   BMI 25.23 kg/m    External Exam: normallabial folds,no lesions noted SVE: Vaginal vault intact, no discharge or bleeding noted. Cervix tilted to left. OS closed given that patient reports partial hysterectomy. Bimanual Exam: no adnexal masses,no tenderness or pain elicited    ASSESSMENT/PLAN:   Cervical cancer screening Pap smear for cytology HPV GC/C, wet mount HIV, RPR labs  Cancer Screening -referral for colonoscopy -Mammogram ordered   Carollee Leitz, MD [place]

## 2020-03-14 NOTE — Patient Instructions (Addendum)
It was a pleasure seeing you today.  You were seen annual PAP smear.  I will send you a copy of the results and the STI screening.  I have also sent a referral to have a mammogram and colonoscopy.  Your bad cholesterol was slightly elevated.  I encourage you to begin exercise and diet routine and we will recheck in 3 months  Have a pleasant day   Follow up as needed.  Carollee Leitz MD   High Cholesterol  High cholesterol is a condition in which the blood has high levels of a white, waxy, fat-like substance (cholesterol). The human body needs small amounts of cholesterol. The liver makes all the cholesterol that the body needs. Extra (excess) cholesterol comes from the food that we eat. Cholesterol is carried from the liver by the blood through the blood vessels. If you have high cholesterol, deposits (plaques) may build up on the walls of your blood vessels (arteries). Plaques make the arteries narrower and stiffer. Cholesterol plaques increase your risk for heart attack and stroke. Work with your health care provider to keep your cholesterol levels in a healthy range. What increases the risk? This condition is more likely to develop in people who:  Eat foods that are high in animal fat (saturated fat) or cholesterol.  Are overweight.  Are not getting enough exercise.  Have a family history of high cholesterol. What are the signs or symptoms? There are no symptoms of this condition. How is this diagnosed? This condition may be diagnosed from the results of a blood test.  If you are older than age 80, your health care provider may check your cholesterol every 4-6 years.  You may be checked more often if you already have high cholesterol or other risk factors for heart disease. The blood test for cholesterol measures:  "Bad" cholesterol (LDL cholesterol). This is the main type of cholesterol that causes heart disease. The desired level for LDL is less than 100.  "Good"  cholesterol (HDL cholesterol). This type helps to protect against heart disease by cleaning the arteries and carrying the LDL away. The desired level for HDL is 60 or higher.  Triglycerides. These are fats that the body can store or burn for energy. The desired number for triglycerides is lower than 150.  Total cholesterol. This is a measure of the total amount of cholesterol in your blood, including LDL cholesterol, HDL cholesterol, and triglycerides. A healthy number is less than 200. How is this treated? This condition is treated with diet changes, lifestyle changes, and medicines. Diet changes  This may include eating more whole grains, fruits, vegetables, nuts, and fish.  This may also include cutting back on red meat and foods that have a lot of added sugar. Lifestyle changes  Changes may include getting at least 40 minutes of aerobic exercise 3 times a week. Aerobic exercises include walking, biking, and swimming. Aerobic exercise along with a healthy diet can help you maintain a healthy weight.  Changes may also include quitting smoking. Medicines  Medicines are usually given if diet and lifestyle changes have failed to reduce your cholesterol to healthy levels.  Your health care provider may prescribe a statin medicine. Statin medicines have been shown to reduce cholesterol, which can reduce the risk of heart disease. Follow these instructions at home: Eating and drinking If told by your health care provider:  Eat chicken (without skin), fish, veal, shellfish, ground [place] breast, and round or loin cuts of red meat.  Do  not eat fried foods or fatty meats, such as hot dogs and salami.  Eat plenty of fruits, such as apples.  Eat plenty of vegetables, such as broccoli, potatoes, and carrots.  Eat beans, peas, and lentils.  Eat grains such as barley, rice, couscous, and bulgur wheat.  Eat pasta without cream sauces.  Use skim or nonfat milk, and eat low-fat or nonfat  yogurt and cheeses.  Do not eat or drink whole milk, cream, ice cream, egg yolks, or hard cheeses.  Do not eat stick margarine or tub margarines that contain trans fats (also called partially hydrogenated oils).  Do not eat saturated tropical oils, such as coconut oil and palm oil.  Do not eat cakes, cookies, crackers, or other baked goods that contain trans fats.  General instructions  Exercise as directed by your health care provider. Increase your activity level with activities such as gardening, walking, and taking the stairs.  Take over-the-counter and prescription medicines only as told by your health care provider.  Do not use any products that contain nicotine or tobacco, such as cigarettes and e-cigarettes. If you need help quitting, ask your health care provider.  Keep all follow-up visits as told by your health care provider. This is important. Contact a health care provider if:  You are struggling to maintain a healthy diet or weight.  You need help to start on an exercise program.  You need help to stop smoking. Get help right away if:  You have chest pain.  You have trouble breathing. This information is not intended to replace advice given to you by your health care provider. Make sure you discuss any questions you have with your health care provider. Document Revised: 11/19/2017 Document Reviewed: 05/16/2016 Elsevier Patient Education  [employer].

## 2020-03-15 LAB — HIV ANTIBODY (ROUTINE TESTING W REFLEX): HIV Screen 4th Generation wRfx: NONREACTIVE

## 2020-03-15 LAB — CYTOLOGY - PAP
Adequacy: ABSENT
Chlamydia: NEGATIVE
Comment: NEGATIVE
Comment: NEGATIVE
Comment: NORMAL
Diagnosis: NEGATIVE
High risk HPV: POSITIVE — AB
Neisseria Gonorrhea: NEGATIVE

## 2020-03-15 LAB — RPR: RPR Ser Ql: NONREACTIVE

## 2020-03-16 ENCOUNTER — Encounter: Payer: Self-pay | Admitting: Family Medicine

## 2020-03-16 DIAGNOSIS — Z124 Encounter for screening for malignant neoplasm of cervix: Secondary | ICD-10-CM | POA: Insufficient documentation

## 2020-03-16 NOTE — Assessment & Plan Note (Signed)
Pap smear for cytology HPV GC/C, wet mount HIV, RPR labs

## 2020-03-17 ENCOUNTER — Encounter: Payer: Self-pay | Admitting: Family Medicine

## 2020-03-17 ENCOUNTER — Telehealth: Payer: Self-pay | Admitting: Family Medicine

## 2020-03-17 NOTE — Telephone Encounter (Signed)
Called patient to discuss results of recent PAP.  Unable to reach patient.  Left voice mail for for patient of upcoming appointment and to call if wanting to discuss results.       Spoke with patient at 1307 and informed her of PAP results. All questions were answered.  She is aware of the appointment on April 22 at 1000 for colposcopy.   Carollee Leitz MD

## 2020-03-20 ENCOUNTER — Ambulatory Visit
Admission: RE | Admit: 2020-03-20 | Discharge: 2020-03-20 | Disposition: A | Discharge status: Home / Self Care | Payer: 59 | Source: Ambulatory Visit | Attending: Family Medicine | Admitting: Family Medicine

## 2020-03-20 ENCOUNTER — Other Ambulatory Visit: Payer: Self-pay

## 2020-03-20 DIAGNOSIS — Z1239 Encounter for other screening for malignant neoplasm of breast: Secondary | ICD-10-CM

## 2020-03-21 ENCOUNTER — Ambulatory Visit (INDEPENDENT_AMBULATORY_CARE_PROVIDER_SITE_OTHER): Payer: 59 | Admitting: Family Medicine

## 2020-03-21 ENCOUNTER — Other Ambulatory Visit: Payer: Self-pay

## 2020-03-21 VITALS — BP 132/76 | HR 100 | Ht 62.0 in | Wt 149.0 lb

## 2020-03-21 DIAGNOSIS — B977 Papillomavirus as the cause of diseases classified elsewhere: Secondary | ICD-10-CM

## 2020-03-21 NOTE — Addendum Note (Signed)
Addended by: Andrena Mews T on: 03/21/2020 12:28 PM   Modules accepted: Level of Service

## 2020-03-21 NOTE — Progress Notes (Signed)
    SUBJECTIVE:   CHIEF COMPLAINT / HPI: colposcopy for HR+ HPV  Pap smear on 4/15/21with (+) HR HPV, transitional zone absent. Patient had never had an abnormal pap smear to her knowledge. Patient had a reported partial hysterectomy in 1998 for fibroids, patient believed she had part of uterus, cervix and ovaries.   Discussed risks and benefits of colposcopy, that it is the best method for evaluating for cervical cancer after HR (+) HPV. Risks of performing procedure include minor bleeding if biopsy indicated, benefits include diagnosis of possible cervical cancer. Not performing colposcopy increases risk of undiagnosed cervical cancer. Patient agreed to proceed.  PERTINENT  PMH / PSH: HR (+) HPV, partial hysterectomy 1998 for fibroids  OBJECTIVE:   BP 132/76   Pulse 100   Ht 5\' 2"  (1.575 m)   Wt 149 lb (67.6 kg)   LMP 02/26/2019   SpO2 99%   BMI 27.25 kg/m   Physical Exam Vitals and nursing note reviewed. Exam conducted with a chaperone present.  Constitutional:      General: She is not in acute distress.    Appearance: Normal appearance. She is normal weight. She is not ill-appearing or toxic-appearing.  HENT:     Head: Normocephalic and atraumatic.  Genitourinary:    General: Normal vulva.     Exam position: Lithotomy position.     Labia:        Right: No rash, tenderness, lesion or injury.        Left: No rash, tenderness, lesion or injury.      Comments: PELVIC:  Normal appearing external female genitalia, normal vaginal epithelium, no abnormal discharge. Only vaginal vault viewed, no cervix identified.  Bimanual: only soft tissue palpated, no cervix or uterus felt. No palpable adnexal masses.   Musculoskeletal:     Right lower leg: No edema.     Left lower leg: No edema.  Skin:    General: Skin is warm and dry.  Neurological:     Mental Status: She is alert.  Psychiatric:        Mood and Affect: Mood normal.        Behavior: Behavior normal.    ASSESSMENT/PLAN:   Ms. Alison Perez is a 59yo woman with no palpable uterus or cervix, with (+) HR HPV  High risk HPV infection Patient presented for colposcopy today, unable to examine cervix as patient does not appear to have a cervix or uterus. Pap smear with transitional zone absent and history of hysterectomy, although patient believed it was a partial hysterectomy. For HR (+) HPV, patient is not at risk for cervical cancer as she does not have a cervix, which was palpated by both this provider and supervising physician, Dr. Gwendlyn Deutscher. No procedure record in Epic to confirm total hysterectomy as procedure was in 1998. - recommend patient discuss with PCP to obtain TVUS to confirm total hysterectomy and absence of uterus and cervix. - If confirmed, no need for further pap smears    Gladys Damme, MD [place] [facility]

## 2020-03-21 NOTE — Patient Instructions (Signed)
It was nice seeing you today. Your colposcopy is negative. Although, it appears as though you don't have a cervix. If that is the case, you will not need to get PAP done routinely.  Please return to your PCP for pelvic ultrasound to confirm absence of cervix.   Let us know if you have additional questions.

## 2020-03-21 NOTE — Assessment & Plan Note (Signed)
Patient presented for colposcopy today, unable to examine cervix as patient does not appear to have a cervix or uterus. Pap smear with transitional zone absent and history of hysterectomy, although patient believed it was a partial hysterectomy. For HR (+) HPV, patient is not at risk for cervical cancer as she does not have a cervix, which was palpated by both this provider and supervising physician, Dr. Gwendlyn Deutscher. No procedure record in Epic to confirm total hysterectomy as procedure was in 1998. - recommend patient discuss with PCP to obtain TVUS to confirm total hysterectomy and absence of uterus and cervix. - If confirmed, no need for further pap smears

## 2020-03-26 ENCOUNTER — Encounter: Payer: Self-pay | Admitting: Family Medicine

## 2020-04-10 ENCOUNTER — Encounter: Payer: 59 | Admitting: Gastroenterology

## 2020-04-12 ENCOUNTER — Ambulatory Visit (AMBULATORY_SURGERY_CENTER): Payer: Self-pay | Admitting: *Deleted

## 2020-04-12 ENCOUNTER — Other Ambulatory Visit: Payer: Self-pay

## 2020-04-12 VITALS — Temp 96.8°F | Ht 64.0 in | Wt 142.0 lb

## 2020-04-12 DIAGNOSIS — Z01818 Encounter for other preprocedural examination: Secondary | ICD-10-CM

## 2020-04-12 DIAGNOSIS — Z1211 Encounter for screening for malignant neoplasm of colon: Secondary | ICD-10-CM

## 2020-04-12 MED ORDER — NA SULFATE-K SULFATE-MG SULF 17.5-3.13-1.6 GM/177ML PO SOLN: ORAL | 0 refills | Status: DC

## 2020-04-12 NOTE — Progress Notes (Signed)
Patient is here in-person for PV. Patient denies any allergies to eggs or soy. Patient denies any problems with anesthesia/sedation. Patient denies any oxygen use at home. Patient denies taking any diet/weight loss medications or blood thinners. Patient is not being treated for MRSA or C-diff. Patient is aware of our care-partner policy and 0000000 safety protocol. EMMI education assisgned to the patient for the procedure, this was explained and instructions given to patient.   COVID-19 screening test is on 04/24/20, the pt is aware.   Prep Prescription coupon given to the patient.

## 2020-04-24 ENCOUNTER — Other Ambulatory Visit: Payer: Self-pay

## 2020-04-24 ENCOUNTER — Other Ambulatory Visit: Payer: Self-pay | Admitting: Gastroenterology

## 2020-04-24 ENCOUNTER — Ambulatory Visit (INDEPENDENT_AMBULATORY_CARE_PROVIDER_SITE_OTHER): Payer: 59

## 2020-04-24 DIAGNOSIS — Z1159 Encounter for screening for other viral diseases: Secondary | ICD-10-CM

## 2020-04-25 LAB — SARS CORONAVIRUS 2 (TAT 6-24 HRS): SARS Coronavirus 2: NEGATIVE

## 2020-04-26 ENCOUNTER — Other Ambulatory Visit: Payer: Self-pay

## 2020-04-26 ENCOUNTER — Ambulatory Visit (AMBULATORY_SURGERY_CENTER): Payer: 59 | Admitting: Gastroenterology

## 2020-04-26 ENCOUNTER — Encounter: Payer: Self-pay | Admitting: Gastroenterology

## 2020-04-26 VITALS — BP 100/56 | HR 68 | Temp 97.3°F | Resp 11 | Ht 64.0 in | Wt 142.0 lb

## 2020-04-26 DIAGNOSIS — K635 Polyp of colon: Secondary | ICD-10-CM

## 2020-04-26 DIAGNOSIS — K633 Ulcer of intestine: Secondary | ICD-10-CM

## 2020-04-26 DIAGNOSIS — Z1211 Encounter for screening for malignant neoplasm of colon: Secondary | ICD-10-CM

## 2020-04-26 DIAGNOSIS — D125 Benign neoplasm of sigmoid colon: Secondary | ICD-10-CM

## 2020-04-26 DIAGNOSIS — K5190 Ulcerative colitis, unspecified, without complications: Secondary | ICD-10-CM | POA: Diagnosis not present

## 2020-04-26 MED ORDER — SODIUM CHLORIDE 0.9 % IV SOLN: 500.0000 mL | Freq: Once | INTRAVENOUS | Status: DC

## 2020-04-26 NOTE — Progress Notes (Signed)
pt tolerated well. VSS. awake and to recovery. Report given to RN.  

## 2020-04-26 NOTE — Progress Notes (Signed)
Called to room to assist during endoscopic procedure.  Patient ID and intended procedure confirmed with present staff. Received instructions for my participation in the procedure from the performing physician.  

## 2020-04-26 NOTE — Op Note (Signed)
[facility] Patient Name: Alison Perez Procedure Date: 04/26/2020 1:15 PM MRN: [id number] Endoscopist: Ladene Artist , MD Age: 59 Referring MD:  Date of Birth: December 04, 1960 Gender: Female Account #: [id number] Procedure:                Colonoscopy Indications:              Screening for colorectal malignant neoplasm Medicines:                Monitored Anesthesia Care Procedure:                Pre-Anesthesia Assessment:                           - Prior to the procedure, a History and Physical                            was performed, and patient medications and                            allergies were reviewed. The patient's tolerance of                            previous anesthesia was also reviewed. The risks                            and benefits of the procedure and the sedation                            options and risks were discussed with the patient.                            All questions were answered, and informed consent                            was obtained. Prior Anticoagulants: The patient has                            taken no previous anticoagulant or antiplatelet                            agents. ASA Grade Assessment: II - A patient with                            mild systemic disease. After reviewing the risks                            and benefits, the patient was deemed in                            satisfactory condition to undergo the procedure.                           After obtaining informed consent, the colonoscope  was passed under direct vision. Throughout the                            procedure, the patient's blood pressure, pulse, and                            oxygen saturations were monitored continuously. The                            Colonoscope was introduced through the anus and                            advanced to the the cecum, identified by                            appendiceal orifice and  ileocecal valve. The                            ileocecal valve, appendiceal orifice, and rectum                            were photographed. The quality of the bowel                            preparation was good. The colonoscopy was performed                            without difficulty. The patient tolerated the                            procedure well. Scope In: 1:25:41 PM Scope Out: 1:48:36 PM Scope Withdrawal Time: 0 hours 16 minutes 11 seconds  Total Procedure Duration: 0 hours 22 minutes 55 seconds  Findings:                 The perianal and digital rectal examinations were                            normal.                           A few localized non-bleeding erosions were found at                            the ileocecal valve. No stigmata of recent bleeding                            were seen. Biopsies were taken with a cold forceps                            for histology.                           Two sessile polyps were found in the sigmoid colon.  The polyps were 7 mm in size. These polyps were                            removed with a cold snare. Resection and retrieval                            were complete.                           Multiple medium-mouthed diverticula were found in                            the entire colon. There was no evidence of                            diverticular bleeding.                           The exam was otherwise without abnormality on                            direct and retroflexion views. Complications:            No immediate complications. Estimated blood loss:                            None. Estimated Blood Loss:     Estimated blood loss: none. Impression:               - A few erosions at the ileocecal valve. Biopsied.                           - Two 7 mm polyps in the sigmoid colon, removed                            with a cold snare. Resected and retrieved.                           -  Mild diverticulosis in the entire examined colon.                           - The examination was otherwise normal on direct                            and retroflexion views. Recommendation:           - Repeat colonoscopy after studies are complete for                            surveillance/ screening based on pathology results.                           - Patient has a contact number available for                            emergencies. The signs  and symptoms of potential                            delayed complications were discussed with the                            patient. Return to normal activities tomorrow.                            Written discharge instructions were provided to the                            patient.                           - High fiber diet.                           - Continue present medications.                           - Await pathology results. Ladene Artist, MD 04/26/2020 1:52:41 PM This report has been signed electronically.

## 2020-04-26 NOTE — Patient Instructions (Signed)
Handouts on high fiber diet, polyps, and diverticulosis given to you today  Await pathology results       YOU HAD AN ENDOSCOPIC PROCEDURE TODAY AT [facility]:   Refer to the procedure report that was given to you for any specific questions about what was found during the examination.  If the procedure report does not answer your questions, please call your gastroenterologist to clarify.  If you requested that your care partner not be given the details of your procedure findings, then the procedure report has been included in a sealed envelope for you to review at your convenience later.  YOU SHOULD EXPECT: Some feelings of bloating in the abdomen. Passage of more gas than usual.  Walking can help get rid of the air that was put into your GI tract during the procedure and reduce the bloating. If you had a lower endoscopy (such as a colonoscopy or flexible sigmoidoscopy) you may notice spotting of blood in your stool or on the toilet paper. If you underwent a bowel prep for your procedure, you may not have a normal bowel movement for a few days.  Please Note:  You might notice some irritation and congestion in your nose or some drainage.  This is from the oxygen used during your procedure.  There is no need for concern and it should clear up in a day or so.  SYMPTOMS TO REPORT IMMEDIATELY:   Following lower endoscopy (colonoscopy or flexible sigmoidoscopy):  Excessive amounts of blood in the stool  Significant tenderness or worsening of abdominal pains  Swelling of the abdomen that is new, acute  Fever of 100F or higher  For urgent or emergent issues, a gastroenterologist can be reached at any hour by calling [phone]. Do not use MyChart messaging for urgent concerns.    DIET:  We do recommend a small meal at first, but then you may proceed to your regular diet.  Drink plenty of fluids but you should avoid alcoholic beverages for 24 hours.  ACTIVITY:  You should plan  to take it easy for the rest of today and you should NOT DRIVE or use heavy machinery until tomorrow (because of the sedation medicines used during the test).    FOLLOW UP: Our staff will call the number listed on your records 48-72 hours following your procedure to check on you and address any questions or concerns that you may have regarding the information given to you following your procedure. If we do not reach you, we will leave a message.  We will attempt to reach you two times.  During this call, we will ask if you have developed any symptoms of COVID 19. If you develop any symptoms (ie: fever, flu-like symptoms, shortness of breath, cough etc.) before then, please call [phone].  If you test positive for Covid 19 in the 2 weeks post procedure, please call and report this information to [place].    If any biopsies were taken you will be contacted by phone or by letter within the next 1-3 weeks.  Please call us at [phone] if you have not heard about the biopsies in 3 weeks.    SIGNATURES/CONFIDENTIALITY: You and/or your care partner have signed paperwork which will be entered into your electronic medical record.  These signatures attest to the fact that that the information above on your After Visit Summary has been reviewed and is understood.  Full responsibility of the confidentiality of this discharge information lies with you  and/or your care-partner. 

## 2020-04-26 NOTE — Progress Notes (Signed)
Pt's states no medical or surgical changes since previsit or office visit. 

## 2020-04-30 ENCOUNTER — Telehealth: Payer: Self-pay

## 2020-04-30 NOTE — Telephone Encounter (Signed)
  Follow up Call-  Call back number 04/26/2020  Post procedure Call Back phone  # [phone]  Permission to leave phone message Yes  Some recent data might be hidden     Patient questions:  Do you have a fever, pain , or abdominal swelling? No. Pain Score  0 *  Have you tolerated food without any problems? Yes.    Have you been able to return to your normal activities? Yes.    Do you have any questions about your discharge instructions: Diet   No. Medications  No. Follow up visit  No.  Do you have questions or concerns about your Care? No.  Actions: * If pain score is 4 or above: No action needed, pain <4.  1. Have you developed a fever since your procedure? no  2.   Have you had an respiratory symptoms (SOB or cough) since your procedure? no  3.   Have you tested positive for COVID 19 since your procedure no  4.   Have you had any family members/close contacts diagnosed with the COVID 19 since your procedure?  no   If yes to any of these questions please route to Joylene John, RN and Erenest Rasher, RN

## 2020-04-30 NOTE — Telephone Encounter (Signed)
First attempt follow up call to pt, lm on vm 

## 2020-05-20 ENCOUNTER — Encounter: Payer: Self-pay | Admitting: Gastroenterology

## 2020-06-11 NOTE — Progress Notes (Signed)
    SUBJECTIVE:   CHIEF COMPLAINT / HPI: For checkup  Patient reports no acute concerns today.  Syncopal episodes Patient reports having syncopal episodes ongoing for about 40 years.  Last syncopal episode on Mother's Day of this year lasting approximately 2 minutes.  Family reports foaming at mouth and whole body stiffening with eyes rolling in the back of head.  Patient reports he vomits and then everything is okay and she is back to her normal self.  She reports having seen a neurologist years ago and was told she was having vomiting spells.  No work up have been completed at that time that she can recall. She reports that the syncopal episode are random without any preceding symptoms.  Denies any visual changes, headaches or weakness.   PERTINENT  PMH / PSH:  Hidradenitis suppurativa-currently on Humira therapy   OBJECTIVE:   BP 114/64   Pulse 77   Wt 144 lb (65.3 kg)   LMP 02/26/2019   SpO2 97%   BMI 24.72 kg/m    General: Alert and oriented, no apparent distress  Neuro: CN II--XII intact, motor and sensory intact, normal gait Psych: Behavior and speech appropriate to situation  ASSESSMENT/PLAN:   H/O syncope Unclear etiology of syncopal episodes.  Given family reports of whole body stiffening, eyes rolling back of head and foaming at mouth cannot rule out possible epileptiform etiology. -Refer sent to neurology for further evaluation -Strict return precautions for similar symptoms -Follow-up as needed.       Carollee Leitz, MD [place]

## 2020-06-11 NOTE — Patient Instructions (Addendum)
Thank you for coming to see me today. It was a pleasure. Today we talked about:   Referral to Neurology for passing out spells.  They will call you with an appointment  Please follow-up with PCP in 3 months  If you have any questions or concerns, please do not hesitate to call the office at [phone].  Best,   Carollee Leitz, MD [facility]

## 2020-06-12 ENCOUNTER — Other Ambulatory Visit: Payer: Self-pay

## 2020-06-12 ENCOUNTER — Encounter: Payer: Self-pay | Admitting: Family Medicine

## 2020-06-12 ENCOUNTER — Ambulatory Visit (INDEPENDENT_AMBULATORY_CARE_PROVIDER_SITE_OTHER): Payer: 59 | Admitting: Family Medicine

## 2020-06-12 VITALS — BP 114/64 | HR 77 | Wt 144.0 lb

## 2020-06-12 DIAGNOSIS — Z87898 Personal history of other specified conditions: Secondary | ICD-10-CM

## 2020-06-16 ENCOUNTER — Encounter: Payer: Self-pay | Admitting: Family Medicine

## 2020-06-16 DIAGNOSIS — Z87898 Personal history of other specified conditions: Secondary | ICD-10-CM | POA: Insufficient documentation

## 2020-06-16 NOTE — Assessment & Plan Note (Signed)
Unclear etiology of syncopal episodes.  Given family reports of whole body stiffening, eyes rolling back of head and foaming at mouth cannot rule out possible epileptiform etiology. -Refer sent to neurology for further evaluation -Strict return precautions for similar symptoms -Follow-up as needed.

## 2020-06-17 ENCOUNTER — Encounter: Payer: Self-pay | Admitting: Neurology

## 2020-07-05 ENCOUNTER — Telehealth: Payer: Self-pay

## 2020-07-05 NOTE — Telephone Encounter (Signed)
Patient calls nurse line requesting a dermatology referral to continue on Humira. Patient reports her insurance changed and he current dermatoligist will no longer be able to accept her. Please advise.

## 2020-07-06 ENCOUNTER — Other Ambulatory Visit: Payer: Self-pay | Admitting: Family Medicine

## 2020-07-06 DIAGNOSIS — L732 Hidradenitis suppurativa: Secondary | ICD-10-CM

## 2020-07-06 NOTE — Telephone Encounter (Signed)
Referral sent 

## 2020-08-09 ENCOUNTER — Ambulatory Visit: Payer: 59

## 2020-09-20 ENCOUNTER — Ambulatory Visit (INDEPENDENT_AMBULATORY_CARE_PROVIDER_SITE_OTHER): Payer: 59 | Admitting: Neurology

## 2020-09-20 ENCOUNTER — Other Ambulatory Visit: Payer: Self-pay

## 2020-09-20 ENCOUNTER — Encounter: Payer: Self-pay | Admitting: Neurology

## 2020-09-20 VITALS — BP 134/83 | HR 94 | Ht 64.0 in | Wt 145.2 lb

## 2020-09-20 DIAGNOSIS — R55 Syncope and collapse: Secondary | ICD-10-CM

## 2020-09-20 NOTE — Patient Instructions (Signed)
1. Schedule MRI brain with and without contrast  2. Schedule 1-hour EEG, if normal we will do a 24-hour EEG  3. Refer to Cardiology for recurrent syncope  4. Follow-up after tests, call for any changes
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
## 2020-09-24 ENCOUNTER — Ambulatory Visit (INDEPENDENT_AMBULATORY_CARE_PROVIDER_SITE_OTHER): Payer: 59 | Admitting: Cardiology

## 2020-09-24 ENCOUNTER — Other Ambulatory Visit: Payer: Self-pay

## 2020-09-24 ENCOUNTER — Encounter: Payer: Self-pay | Admitting: Cardiology

## 2020-09-24 VITALS — BP 108/86 | HR 83 | Ht 62.0 in | Wt 143.0 lb

## 2020-09-24 DIAGNOSIS — R55 Syncope and collapse: Secondary | ICD-10-CM | POA: Diagnosis not present

## 2020-09-24 DIAGNOSIS — Z7189 Other specified counseling: Secondary | ICD-10-CM | POA: Diagnosis not present

## 2020-09-24 DIAGNOSIS — Z720 Tobacco use: Secondary | ICD-10-CM | POA: Diagnosis not present

## 2020-09-24 DIAGNOSIS — Z716 Tobacco abuse counseling: Secondary | ICD-10-CM

## 2020-09-24 NOTE — Patient Instructions (Addendum)
Medication Instructions:  Your Physician recommend you continue on your current medication as directed.    *If you need a refill on your cardiac medications before your next appointment, please call your pharmacy*   Lab Work: None ordered   Testing/Procedures: Your physician has requested that you have an echocardiogram. Echocardiography is a painless test that uses sound waves to create images of your heart. It provides your doctor with information about the size and shape of your heart and how well your heart's chambers and valves are working. This procedure takes approximately one hour. There are no restrictions for this procedure. [place] [address]     Follow-Up: At [facility], you and your health needs are our priority.  As part of our continuing mission to provide you with exceptional heart care, we have created designated Provider Care Teams.  These Care Teams include your primary Cardiologist (physician) and Advanced Practice Providers (APPs -  Physician Assistants and Nurse Practitioners) who all work together to provide you with the care you need, when you need it.  We recommend signing up for the patient portal called "MyChart".  Sign up information is provided on this After Visit Summary.  MyChart is used to connect with patients for Virtual Visits (Telemedicine).  Patients are able to view lab/test results, encounter notes, upcoming appointments, etc.  Non-urgent messages can be sent to your provider as well.   To learn more about what you can do with MyChart, go to [email or web address].    Your next appointment:   1 year(s)  The format for your next appointment:   In Person  Provider:   Buford Dresser, MD   Other Instructions -avoid dehydration. Often it requires high volumes of fluids, often with salt/electrolytes included, to stay hydrated. Some people are very sensitive to fluid shifts and dehydration. Oral rehydration is preferred, and  routine use of IV fluids is not recommended. -if tolerated, compression stocking can assist with fluid management and prevent pooling in the legs. -slow position changes are recommended -if there is a feeling of severe lightheadedness, like near to passing out, recommend lying on the floor on the back, with legs elevated up on a chair or up against the wall. -the best long term management is gradual exercise conditioning. I recommend seated exercises such as bike to start, to avoid the risk of falling with lightheadedness. Exercise programs, either through supervised programs like cardiac rehab or through personal programs, should focus on gradually increasing exercise tolerance and conditioning.

## 2020-09-24 NOTE — Progress Notes (Addendum)
Cardiology Office Note:    Date:  09/24/2020   ID:  Alison Perez, DOB 1961/11/24, MRN [id number]  PCP:  Carollee Leitz, MD  Cardiologist:  Buford Dresser, MD  Referring MD: Cameron Sprang, MD   CC: new patient consultation for recurrent syncope  History of Present Illness:    Alison Perez is a 59 y.o. female with a hx of recurrent syncope who is seen as a new consult at the request of Delice Lesch Lezlie Octave, MD for the evaluation and management of syncope.  Note from 09/20/20 with Dr. Delice Lesch reviewed. Referred for 40 year history of intermittent but recurrent syncope. Noted prior diagnosis of multiple sclerosis, but on no agents for this and prior MRI without evidence of lesions.  Here with her son today.   Syncope: Initial episode: when she was in her early 45s. Most recent episode was mother's day (either 2020 or 2021) and definitely 12/2017.  Frequency: can be years in between, or sometimes can be weeks in between Duration of episodes: very brief, wakes up quickly. Her eyes are open the entire time, but she is nonresponsive, cannot move arm/legs until she vomits and wakes up. Not confused after, but has had loss of bladder control. Presyncopal symptoms: always has prodrome. Feels tingling/hot, tension in her shoulders to the point that the pain is unbearable. Feels lightheaded. Sometimes happens after she has been really upset/stressed.  Post syncope symptoms: feels better after she vomits, no residual symptoms.  Aggravating/alleviating factors: only notices that getting upset and intense pain can cause it, but can also happen without a trigger.  Pre-existing medical conditions: no clear, but had a breathing issue after a dental procedure in her early 72s, had to be in ICU with a breathing tube.  Potential medication/supplement interactions: none noted Prior workup: pending EEG and MRI. ECG: normal  Family history: none that she is aware of. Current tobacco use, smokes about  1-2 black & milds per day, 1/4 ppd of cigarettes. Rare alcohol. Doesn't sleep well.   Very rare sharp chest pain, none in a long time, sharp, worse with palpation. None in a long time.   Denies shortness of breath at rest or with normal exertion. No PND, orthopnea, LE edema or unexpected weight gain. No palpitations.   Past Medical History:  Diagnosis Date  . Diabetes mellitus without complication (HCC)    no meds. pt denies  . Fibroid   . Glaucoma   . Hidradenitis   . Hypertension   . MS (multiple sclerosis) ([place])   . Seizures ([place]) 03/2019   " MILD "    Past Surgical History:  Procedure Laterality Date  . MOUTH SURGERY    . PERINEAL HIDRADENITIS EXCISION    . UTERINE FIBROID SURGERY      Current Medications: Current Outpatient Medications on File Prior to Visit  Medication Sig  . HUMIRA PEN 40 MG/0.4ML PNKT SMARTSIG:40 Milligram(s) SUB-Q Once a Week  . Misc Natural Products (ELDERBERRY ZINC/VIT C/IMMUNE MT) Use as directed in the mouth or throat.  . triamcinolone (KENALOG) 0.025 % ointment Apply 1 application topically 2 (two) times daily.  Marland Kitchen VITAMIN D PO Take by mouth.  Marland Kitchen ibuprofen (ADVIL) 800 MG tablet Take 800 mg by mouth every 8 (eight) hours. (Patient not taking: Reported on 09/24/2020)   No current facility-administered medications on file prior to visit.     Allergies:   Apple, Artichoke [cynara scolymus (artichoke)], and Peach [prunus persica]   Social History   Tobacco Use  .  Smoking status: Current Every Day Smoker    Packs/day: 0.25    Years: 40.00    Pack years: 10.00    Types: Cigars  . Smokeless tobacco: Never Used  Vaping Use  . Vaping Use: Never used  Substance Use Topics  . Alcohol use: Yes    Comment: occasional  . Drug use: No    Family History: family history includes Healthy in her father and mother; Stomach cancer in her maternal grandmother. There is no history of Colon cancer, Colon polyps, Esophageal cancer, or Rectal  cancer.  ROS:   Please see the history of present illness.  Additional pertinent ROS: Constitutional: Negative for chills, fever, night sweats, unintentional weight loss  HENT: Negative for ear pain and hearing loss.   Eyes: Negative for loss of vision and eye pain.  Respiratory: Negative for cough, sputum, wheezing.   Cardiovascular: See HPI. Gastrointestinal: Negative for abdominal pain, melena, and hematochezia.  Genitourinary: Negative for dysuria and hematuria.  Musculoskeletal: Negative for falls and myalgias.  Skin: Negative for itching and rash.  Neurological: Negative for focal weakness, focal sensory changes and loss of consciousness.  Endo/Heme/Allergies: Does not bruise/bleed easily.     EKGs/Labs/Other Studies Reviewed:    The following studies were reviewed today: No prior cardiac studies  EKG:  EKG is personally reviewed.  The ekg ordered today demonstrates NSR at 83 bpm  Recent Labs: 02/13/2020: BUN 19; Creatinine, Ser 0.77; Hemoglobin 12.0; Platelets 188; Potassium 4.3; Sodium 140; TSH 0.497  Recent Lipid Panel    Component Value Date/Time   CHOL 230 (H) 02/13/2020 1552   TRIG 87 02/13/2020 1552   HDL 47 02/13/2020 1552   CHOLHDL 4.9 (H) 02/13/2020 1552   CHOLHDL 3.6 09/04/2013 0903   VLDL 19 09/04/2013 0903   LDLCALC 168 (H) 02/13/2020 1552    Physical Exam:    VS:  BP 108/86   Pulse 83   Ht 5\' 2"  (1.575 m)   Wt 143 lb (64.9 kg)   LMP 02/26/2019   SpO2 99%   BMI 26.16 kg/m    Orthostatic vitals: Lying 131/87, 79 bpm Sitting 129/89, 87 bpm Standing 124/86, 92 bpm Standing 3 min 129/88, 94 bpm  Wt Readings from Last 3 Encounters:  09/24/20 143 lb (64.9 kg)  09/20/20 145 lb 3.2 oz (65.9 kg)  06/12/20 144 lb (65.3 kg)    GEN: Well nourished, well developed in no acute distress HEENT: Normal, moist mucous membranes NECK: No JVD CARDIAC: regular rhythm, normal S1 and S2, no rubs or gallops. No murmurs. VASCULAR: Radial and DP pulses 2+  bilaterally. No carotid bruits RESPIRATORY:  Clear to auscultation without rales, wheezing or rhonchi  ABDOMEN: Soft, non-tender, non-distended MUSCULOSKELETAL:  Ambulates independently SKIN: Warm and dry, no edema NEUROLOGIC:  Alert and oriented x 3. No focal neuro deficits noted. PSYCHIATRIC:  Normal affect    ASSESSMENT:    1. Recurrent syncope   2. Tobacco abuse   3. Cardiac risk counseling   4. Counseling on health promotion and disease prevention   5. Tobacco abuse counseling    PLAN:    Recurrent syncope: -we discussed the multiple possible etiologies of syncope, including both cardiac and noncardiac causes -we discussed high risk features to watch for; none noted on interview today -we discussed guideline recommended evaluation. ECG today unremarkable, will order echocardiogram to rule out structural heart disease -undergoing neurology workup -if no clear cardiac or neurologic etiology found, we discussed reflex syncope and management strategies. Not orthostatic  on vitals today.  Tobacco abuse counseling: The patient was counseled on tobacco cessation today for 4 minutes.  Counseling included reviewing the risks of smoking tobacco products, how it impacts the patient's current medical diagnoses and different strategies for quitting.  Pharmacotherapy to aid in tobacco cessation was not prescribed today.  Cardiac risk counseling and prevention recommendations: -recommend heart healthy/Mediterranean diet, with whole grains, fruits, vegetable, fish, lean meats, nuts, and olive oil. Limit salt. -recommend moderate walking, 3-5 times/week for 30-50 minutes each session. Aim for at least 150 minutes.week. Goal should be pace of 3 miles/hours, or walking 1.5 miles in 30 minutes -recommend avoidance of tobacco products. Avoid excess alcohol. -ASCVD risk score: The 10-year ASCVD risk score Mikey Bussing DC Brooke Bonito., et al., 2013) is: 8.6%   Values used to calculate the score:     Age: 89 years      Sex: Female     Is Non-Hispanic African American: Yes     Diabetic: No     Tobacco smoker: Yes     Systolic Blood Pressure: 102 mmHg     Is BP treated: No     HDL Cholesterol: 47 mg/dL     Total Cholesterol: 230 mg/dL    Plan for follow up: 1 year or sooner as needed  Buford Dresser, MD, PhD [place]  [facility]    Medication Adjustments/Labs and Tests Ordered: Current medicines are reviewed at length with the patient today.  Concerns regarding medicines are outlined above.  Orders Placed This Encounter  Procedures  . EKG 12-Lead  . ECHOCARDIOGRAM COMPLETE   No orders of the defined types were placed in this encounter.   Patient Instructions  Medication Instructions:  Your Physician recommend you continue on your current medication as directed.    *If you need a refill on your cardiac medications before your next appointment, please call your pharmacy*   Lab Work: None ordered   Testing/Procedures: Your physician has requested that you have an echocardiogram. Echocardiography is a painless test that uses sound waves to create images of your heart. It provides your doctor with information about the size and shape of your heart and how well your heart's chambers and valves are working. This procedure takes approximately one hour. There are no restrictions for this procedure. [place] [address]     Follow-Up: At [facility], you and your health needs are our priority.  As part of our continuing mission to provide you with exceptional heart care, we have created designated Provider Care Teams.  These Care Teams include your primary Cardiologist (physician) and Advanced Practice Providers (APPs -  Physician Assistants and Nurse Practitioners) who all work together to provide you with the care you need, when you need it.  We recommend signing up for the patient portal called "MyChart".  Sign up information is provided on this After Visit Summary.   MyChart is used to connect with patients for Virtual Visits (Telemedicine).  Patients are able to view lab/test results, encounter notes, upcoming appointments, etc.  Non-urgent messages can be sent to your provider as well.   To learn more about what you can do with MyChart, go to [email or web address].    Your next appointment:   1 year(s)  The format for your next appointment:   In Person  Provider:   Buford Dresser, MD   Other Instructions -avoid dehydration. Often it requires high volumes of fluids, often with salt/electrolytes included, to stay hydrated. Some people are very sensitive  to fluid shifts and dehydration. Oral rehydration is preferred, and routine use of IV fluids is not recommended. -if tolerated, compression stocking can assist with fluid management and prevent pooling in the legs. -slow position changes are recommended -if there is a feeling of severe lightheadedness, like near to passing out, recommend lying on the floor on the back, with legs elevated up on a chair or up against the wall. -the best long term management is gradual exercise conditioning. I recommend seated exercises such as bike to start, to avoid the risk of falling with lightheadedness. Exercise programs, either through supervised programs like cardiac rehab or through personal programs, should focus on gradually increasing exercise tolerance and conditioning.    Signed, Buford Dresser, MD PhD 09/24/2020   [place]

## 2020-09-25 ENCOUNTER — Ambulatory Visit (INDEPENDENT_AMBULATORY_CARE_PROVIDER_SITE_OTHER): Payer: 59 | Admitting: Neurology

## 2020-09-25 ENCOUNTER — Telehealth: Payer: Self-pay

## 2020-09-25 DIAGNOSIS — R55 Syncope and collapse: Secondary | ICD-10-CM | POA: Diagnosis not present

## 2020-09-25 NOTE — Telephone Encounter (Signed)
-----   Message from Cameron Sprang, MD sent at 09/25/2020  1:18 PM EDT ----- Pls let her know brain wave test was normal. Proceed with MRI brain as discussed and cardiology tests ordered. Thanks

## 2020-09-25 NOTE — Telephone Encounter (Signed)
Pt called no answer left a voice mail to call the office back  °

## 2020-09-25 NOTE — Procedures (Signed)
ELECTROENCEPHALOGRAM REPORT  Date of Study: 09/25/2020  Patient's Name: Alison Perez MRN: [id number] Date of Birth: Apr 25, 1961  Referring Provider: Dr. Ellouise Newer  Clinical History: This is a 59 year old woman with recurrent syncope with report of body stiffening and unresponsiveness for 1-2 minutes, quick return to baseline.  Medications: ADVIL 800 MG tablet ELDERBERRY ZINC/VIT C/IMMUNE MT KENALOG 0.025 % ointment VITAMIN D PO XALATAN 0.005 % ophthalmic solution   Technical Summary: A multichannel digital 1-hour EEG recording measured by the international 10-20 system with electrodes applied with paste and impedances below 5000 ohms performed in our laboratory with EKG monitoring in an awake and asleep patient.  Hyperventilation was not performed. Photic stimulation was performed.  The digital EEG was referentially recorded, reformatted, and digitally filtered in a variety of bipolar and referential montages for optimal display.    Description: The patient is awake and asleep during the recording.  During maximal wakefulness, there is a symmetric, medium voltage 11 Hz posterior dominant rhythm that attenuates with eye opening.  The record is symmetric.  During drowsiness and sleep, there is an increase in theta slowing of the background.  Vertex waves and symmetric sleep spindles were seen.  Photic stimulation did not elicit any abnormalities.  There were no epileptiform discharges or electrographic seizures seen.    EKG lead was unremarkable.  Impression: This 1-hour awake and asleep EEG is normal.    Clinical Correlation: A normal EEG does not exclude a clinical diagnosis of epilepsy.  If further clinical questions remain, prolonged EEG may be helpful.  Clinical correlation is advised.   Ellouise Newer, M.D.

## 2020-09-25 NOTE — Telephone Encounter (Signed)
Pt called no answer left a voice mail for her to call back

## 2020-09-26 ENCOUNTER — Telehealth: Payer: Self-pay

## 2020-09-26 NOTE — Telephone Encounter (Signed)
-----   Message from Cameron Sprang, MD sent at 09/25/2020  1:18 PM EDT ----- Pls let her know brain wave test was normal. Proceed with MRI brain as discussed and cardiology tests ordered. Thanks

## 2020-09-26 NOTE — Telephone Encounter (Signed)
Pt called informed that brain wave test was normal. Proceed with MRI brain as discussed and cardiology tests ordered

## 2020-10-10 ENCOUNTER — Ambulatory Visit
Admission: RE | Admit: 2020-10-10 | Discharge: 2020-10-10 | Disposition: A | Discharge status: Home / Self Care | Payer: 59 | Source: Ambulatory Visit | Attending: Neurology | Admitting: Neurology

## 2020-10-10 DIAGNOSIS — R55 Syncope and collapse: Secondary | ICD-10-CM

## 2020-10-10 MED ORDER — GADOBENATE DIMEGLUMINE 529 MG/ML IV SOLN
14.0000 mL | Freq: Once | INTRAVENOUS | Status: AC | PRN
  Administered 2020-10-10: 14 mL via INTRAVENOUS

## 2020-10-11 ENCOUNTER — Telehealth: Payer: Self-pay

## 2020-10-11 NOTE — Telephone Encounter (Signed)
-----   Message from Cameron Sprang, MD sent at 10/11/2020  1:13 PM EST ----- Pls let her know MRI brain normal, no evidence of tumor, stroke, or bleed. Thanks

## 2020-10-11 NOTE — Telephone Encounter (Signed)
Pt called no answer left voice mail for pt to call the office back

## 2020-10-11 NOTE — Telephone Encounter (Signed)
Pt called back and was informed that MRI brain normal, no evidence of tumor, stroke, or bleed

## 2020-10-14 ENCOUNTER — Ambulatory Visit (HOSPITAL_COMMUNITY): Payer: 59 | Attending: Cardiology

## 2020-10-14 DIAGNOSIS — R55 Syncope and collapse: Secondary | ICD-10-CM | POA: Insufficient documentation

## 2020-10-14 LAB — ECHOCARDIOGRAM COMPLETE
Area-P 1/2: 4.49 cm2
S' Lateral: 3.5 cm

## 2020-10-14 MED ORDER — PERFLUTREN LIPID MICROSPHERE
1.0000 mL | INTRAVENOUS | Status: AC | PRN
  Administered 2020-10-14: 1 mL via INTRAVENOUS

## 2020-11-24 ENCOUNTER — Encounter: Payer: Self-pay | Admitting: Cardiology

## 2020-12-05 ENCOUNTER — Encounter: Payer: Self-pay | Admitting: Family Medicine

## 2020-12-08 NOTE — Telephone Encounter (Signed)
You can send a message to the [facility] to see if she can still receive the infusion. I don;t have any results of COVID testing.  I think she has to be within 8 days of testing positive.  Thanks Lavella Lemons

## 2021-03-22 ENCOUNTER — Emergency Department (HOSPITAL_COMMUNITY)
Admission: EM | Admit: 2021-03-22 | Discharge: 2021-03-22 | Disposition: A | Discharge status: Home / Self Care | Payer: 59 | Attending: Emergency Medicine | Admitting: Emergency Medicine

## 2021-03-22 DIAGNOSIS — I10 Essential (primary) hypertension: Secondary | ICD-10-CM | POA: Diagnosis not present

## 2021-03-22 DIAGNOSIS — F17290 Nicotine dependence, other tobacco product, uncomplicated: Secondary | ICD-10-CM | POA: Diagnosis not present

## 2021-03-22 DIAGNOSIS — E860 Dehydration: Secondary | ICD-10-CM | POA: Diagnosis not present

## 2021-03-22 DIAGNOSIS — R55 Syncope and collapse: Secondary | ICD-10-CM | POA: Insufficient documentation

## 2021-03-22 DIAGNOSIS — E119 Type 2 diabetes mellitus without complications: Secondary | ICD-10-CM | POA: Diagnosis not present

## 2021-03-22 NOTE — ED Triage Notes (Signed)
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 Patient says her job told her to come to ED to be evaluated and must get note before can return to work., patient says she feels fine now. Patient says she has passed out before when hot.

## 2021-03-22 NOTE — ED Provider Notes (Signed)
[facility] DEPT Provider Note   CSN: [id number] Arrival date & time: 03/22/21  1853     History Chief Complaint  Patient presents with  . Loss of Consciousness    Alison Perez is a 60 y.o. female.  HPI      60yo female with history of htn, MS, (DM per chart but patient denies), presents with concern for syncope from work.  Reports she does not have air conditioning and had not had anything to eat or drink and was hoping that when she got to work there would be [facility] however it was not working when she arrived and felt like she was overheating, hot, generally weak. She walked into the freezer, felt cold and stepped out nearly immediately, then next thing she knew she was on the floor. Reports she had a similar episode of syncope in the past in setting of heat and had work up arranged byher PCP including EEG and cardiac monitoring which did not show underlying cause.  Denies numbness, weakness, difficulty talking or walking, visual changes or facial droop.   No recent illness, no fevers, no cough, urinary symptoms, nausea/vomiting with exception of episode of emesis after waking for syncopal episode. Denies headache, abdominal pain, chest pain, shortness of breath. Has had some nasal congestion consistent with allergies she has yearly with pollen and took at home covid test which was negative. No black or bloody stools or recent illness. Took OTC allergy medication this AM but otherwise no new medications, not on medications. POCT glucose normal with EMS.  She is here as she needs a work note prior to returning to work.   Past Medical History:  Diagnosis Date  . Diabetes mellitus without complication (HCC)    no meds. pt denies  . Fibroid   . Glaucoma   . Hidradenitis   . Hypertension   . MS (multiple sclerosis) ([place])   . Seizures ([place]) 03/2019   " MILD "    Patient Active Problem List   Diagnosis Date Noted  . H/O syncope 06/16/2020  . 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 Cervical cancer screening 03/16/2020  . Establishing care with new doctor, encounter for 02/13/2020    Past Surgical History:  Procedure Laterality Date  . MOUTH SURGERY    . PERINEAL HIDRADENITIS EXCISION    . UTERINE FIBROID SURGERY       OB History    Gravida  3   Para  3   Term  2   Preterm  1   AB      Living        SAB      IAB      Ectopic      Multiple      Live Births              Family History  Problem Relation Age of Onset  . Healthy Mother   . Healthy Father   . Stomach cancer Maternal Grandmother   . Colon cancer Neg Hx   . Colon polyps Neg Hx   . Esophageal cancer Neg Hx   . Rectal cancer Neg Hx     Social History   Tobacco Use  . Smoking status: Current Every Day Smoker    Packs/day: 0.25    Years: 40.00    Pack years: 10.00    Types: Cigars  . Smokeless tobacco: Never Used  Vaping Use  . Vaping Use: Never used  Substance Use Topics  .  Alcohol use: Yes    Comment: occasional  . Drug use: No    Home Medications Prior to Admission medications   Medication Sig Start Date End Date Taking? Authorizing Provider  HUMIRA PEN 40 MG/0.4ML PNKT SMARTSIG:40 Milligram(s) SUB-Q Once a Week 04/10/20   [provider]  ibuprofen (ADVIL) 800 MG tablet Take 800 mg by mouth every 8 (eight) hours. Patient not taking: Reported on 09/24/2020 01/30/20   [provider]  Misc Natural Products (ELDERBERRY ZINC/VIT C/IMMUNE MT) Use as directed in the mouth or throat.    [provider]  triamcinolone (KENALOG) 0.025 % ointment Apply 1 application topically 2 (two) times daily.    [provider]  VITAMIN D PO Take by mouth.    [provider]    Allergies    Apple, Philippa Sicks scolymus (artichoke)], and Peach [prunus persica]  Review of Systems   Review of Systems  Constitutional: Negative for appetite change, fatigue and fever.  HENT: Positive for rhinorrhea.  Negative for sore throat.   Eyes: Negative for visual disturbance.  Respiratory: Negative for cough and shortness of breath.   Cardiovascular: Negative for chest pain.  Gastrointestinal: Positive for nausea and vomiting (one episode after waking from episode). Negative for abdominal pain, blood in stool, constipation and diarrhea.  Genitourinary: Negative for difficulty urinating and dysuria.  Musculoskeletal: Negative for back pain and neck pain.  Skin: Negative for rash.  Neurological: Positive for syncope. Negative for facial asymmetry, speech difficulty, weakness, numbness and headaches.    Physical Exam Updated Vital Signs BP 123/73   Pulse 89   Temp 98.2 F (36.8 C) (Oral)   Resp 15   Ht 5\' 2"  (1.575 m)   LMP 02/26/2019   SpO2 99%   BMI 26.16 kg/m   Physical Exam Vitals and nursing note reviewed.  Constitutional:      General: She is not in acute distress.    Appearance: Normal appearance. She is well-developed. She is not ill-appearing or diaphoretic.  HENT:     Head: Normocephalic and atraumatic.  Eyes:     General: No visual field deficit.    Extraocular Movements: Extraocular movements intact.     Conjunctiva/sclera: Conjunctivae normal.     Pupils: Pupils are equal, round, and reactive to light.  Cardiovascular:     Rate and Rhythm: Normal rate and regular rhythm.     Pulses: Normal pulses.     Heart sounds: Normal heart sounds. No murmur heard. No friction rub. No gallop.   Pulmonary:     Effort: Pulmonary effort is normal. No respiratory distress.     Breath sounds: Normal breath sounds. No wheezing or rales.  Abdominal:     General: There is no distension.     Palpations: Abdomen is soft.     Tenderness: There is no abdominal tenderness. There is no guarding.  Musculoskeletal:        General: No swelling or tenderness.     Cervical back: Normal range of motion.  Skin:    General: Skin is warm and dry.     Findings: No erythema or rash.   Neurological:     General: No focal deficit present.     Mental Status: She is alert and oriented to person, place, and time.     GCS: GCS eye subscore is 4. GCS verbal subscore is 5. GCS motor subscore is 6.     Cranial Nerves: No cranial nerve deficit, dysarthria or facial asymmetry.  Sensory: No sensory deficit.     Motor: No weakness or tremor.     Coordination: Coordination normal. Finger-Nose-Finger Test normal.     Gait: Gait normal.     ED Results / Procedures / Treatments   Labs (all labs ordered are listed, but only abnormal results are displayed) Labs Reviewed - No data to display  EKG EKG Interpretation  Date/Time:  Saturday March 22 2021 20:01:38 EDT Ventricular Rate:  88 PR Interval:  137 QRS Duration: 98 QT Interval:  377 QTC Calculation: 457 R Axis:   87 Text Interpretation: Sinus rhythm Borderline right axis deviation 12 Lead; Mason-Likar No significant change since last tracing Confirmed by Gareth Morgan [phone]) on 03/23/2021 11:07:19 AM   Radiology No results found.  Procedures Procedures   Medications Ordered in ED Medications - No data to display  ED Course  I have reviewed the triage vital signs and the nursing notes.  Pertinent labs & imaging results that were available during my care of the patient were reviewed by me and considered in my medical decision making (see chart for details).    MDM Rules/Calculators/A&P                           60yo female with history of htn, MS, (DM per chart but patient denies), presents with concern for syncope from work.  Clinically suspect dehydration/heat syncope given history provided. She has also had similar episode with thorough outpatient work up.  EKG without acute findings.  Discussed the possibility of obtaining labs but have low suspicion for significant abnormalities given no history to suggest bleeding, had been eating normally/not on diuretics/not vomiting prior to today.  Hx also not  consistent with ACS/PE in absence of CP/dyspnea. No neurologic findings on history or exam.    Recommend hydration, given food in ED. Feel she is stable to return to work at this time per her wish. Patient discharged in stable condition with understanding of reasons to return.    Final Clinical Impression(s) / ED Diagnoses Final diagnoses:  Syncope, unspecified syncope type  Dehydration    Rx / DC Orders ED Discharge Orders    None       Gareth Morgan, MD 03/23/21 1131

## 2022-01-03 IMAGING — MG DIGITAL SCREENING BILAT W/ CAD
4 series · 4 of 4 positions shown · non-contrast
Comparison: Previous exam(s).

CLINICAL DATA: Screening.

EXAM:
DIGITAL SCREENING BILATERAL MAMMOGRAM WITH CAD

[L CC]
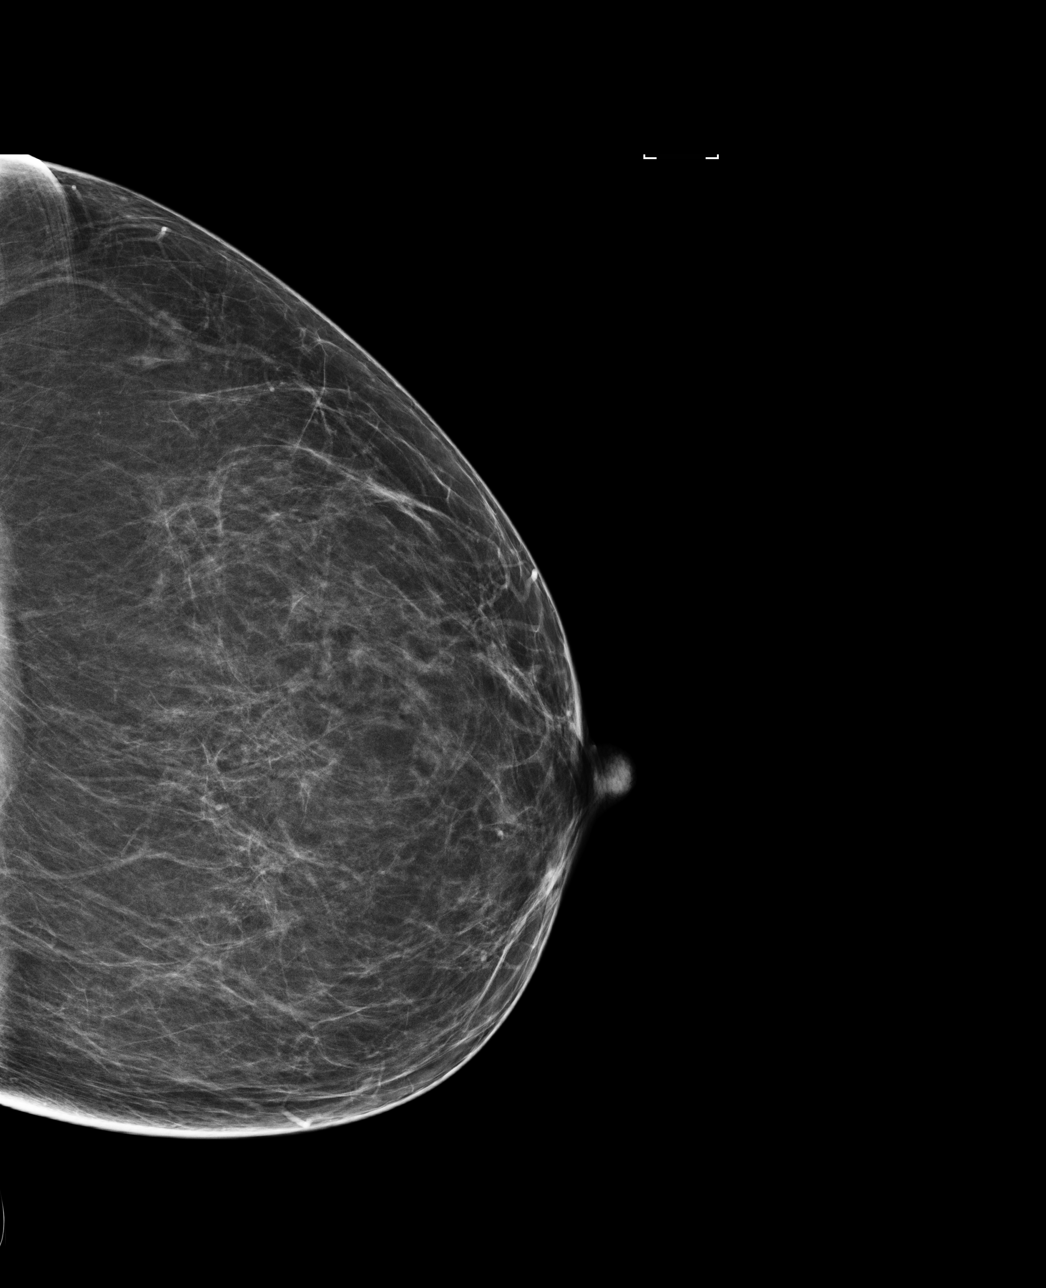

[L MLO]
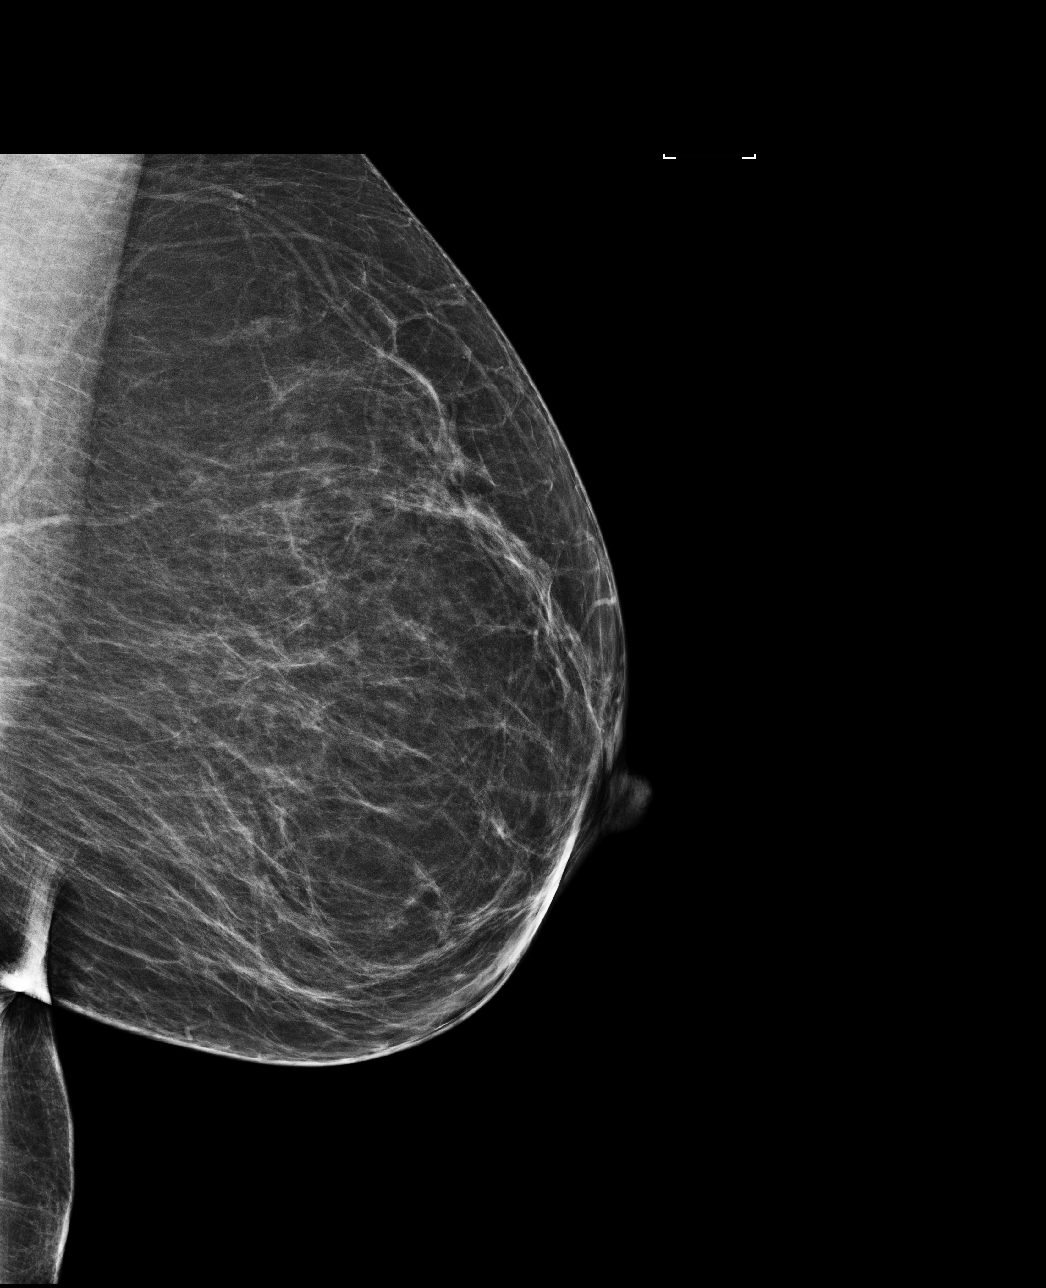

[R CC]
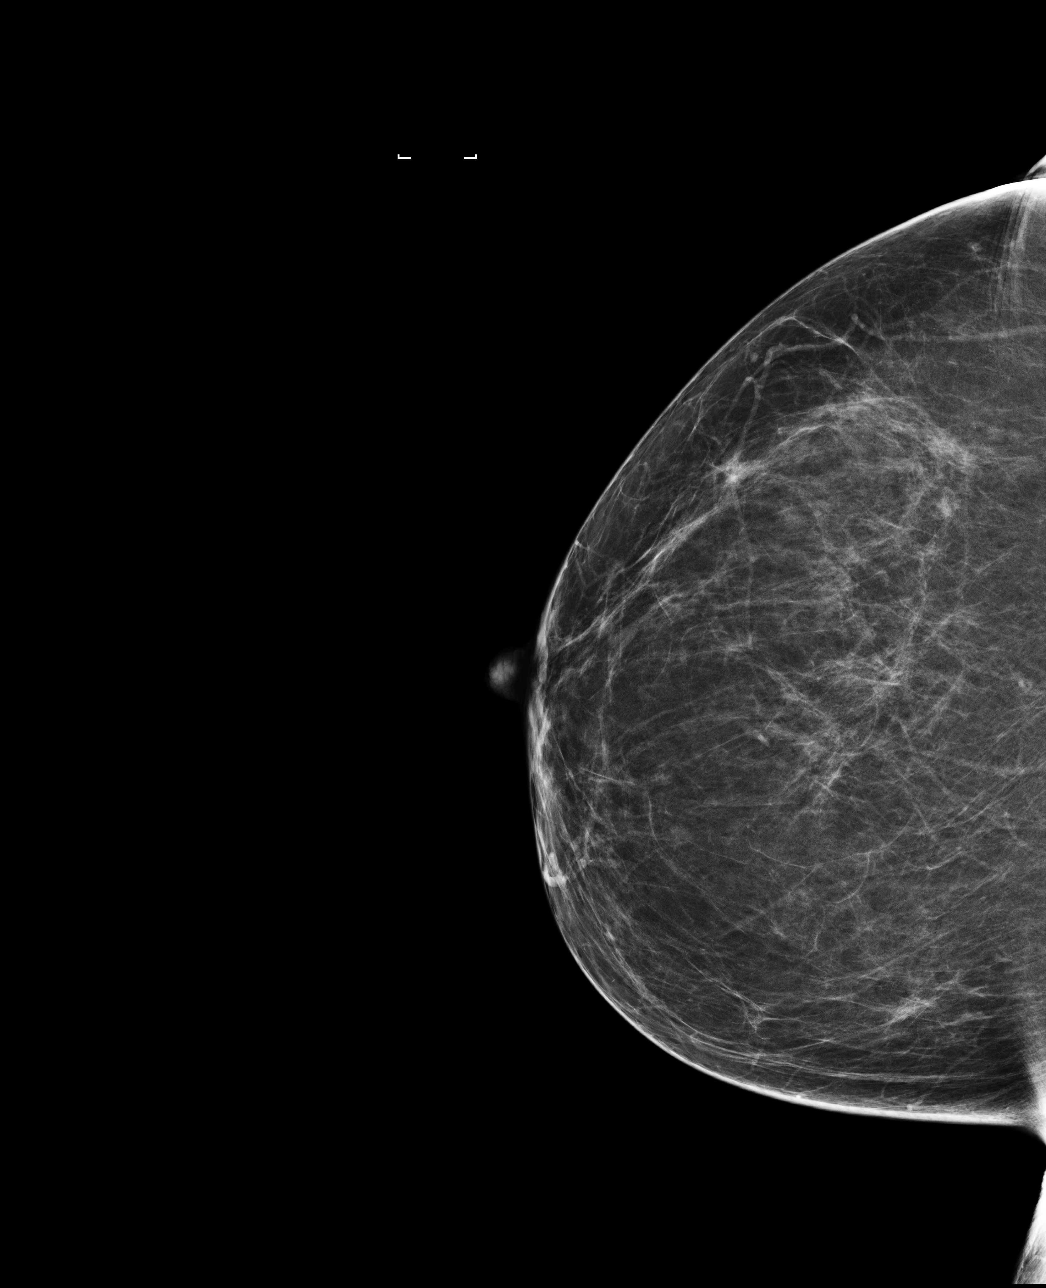

[R MLO]
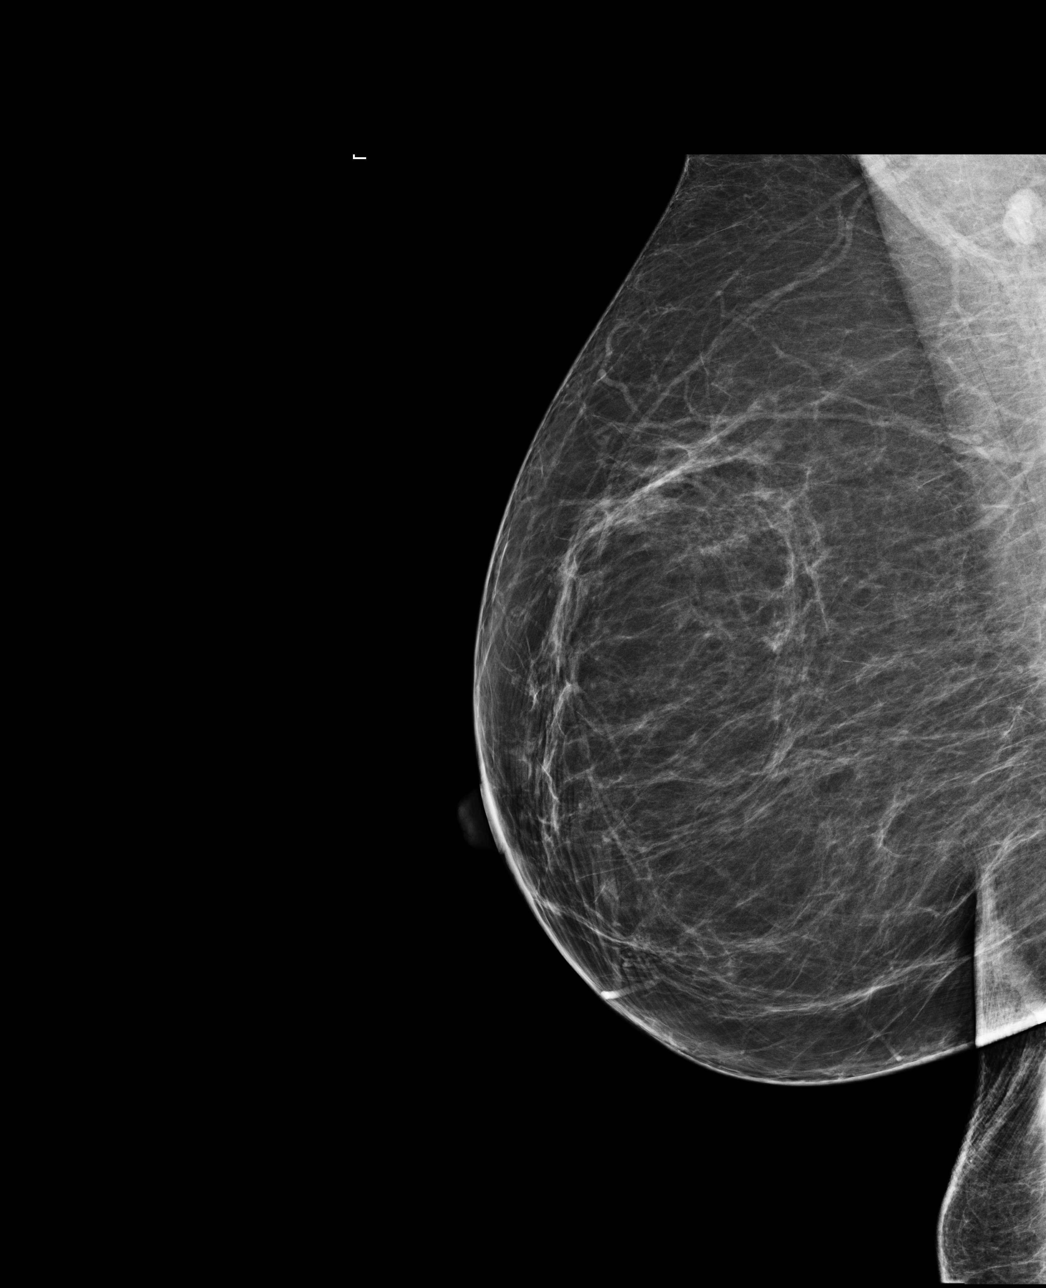

[4 of 4 positions shown; findings below may reference images not displayed]

ACR Breast Density Category b: There are scattered areas of
fibroglandular density.
FINDINGS: There are no findings suspicious for malignancy. Images were
processed with CAD.
IMPRESSION: No mammographic evidence of malignancy. A result letter of this
screening mammogram will be mailed directly to the patient.

RECOMMENDATION:
Screening mammogram in one year. (Code:AS-G-LCT)

BI-RADS CATEGORY  1: Negative.

## 2022-05-05 ENCOUNTER — Encounter: Payer: Self-pay | Admitting: *Deleted

## 2022-09-27 ENCOUNTER — Ambulatory Visit (INDEPENDENT_AMBULATORY_CARE_PROVIDER_SITE_OTHER): Payer: 59

## 2022-09-27 ENCOUNTER — Encounter (HOSPITAL_COMMUNITY): Payer: Self-pay | Admitting: Emergency Medicine

## 2022-09-27 ENCOUNTER — Ambulatory Visit (HOSPITAL_COMMUNITY)
Admission: EM | Admit: 2022-09-27 | Discharge: 2022-09-27 | Disposition: A | Discharge status: Home / Self Care | Payer: 59 | Attending: Physician Assistant | Admitting: Physician Assistant

## 2022-09-27 DIAGNOSIS — M542 Cervicalgia: Secondary | ICD-10-CM

## 2022-09-27 DIAGNOSIS — R0789 Other chest pain: Secondary | ICD-10-CM

## 2022-09-27 DIAGNOSIS — S161XXA Strain of muscle, fascia and tendon at neck level, initial encounter: Secondary | ICD-10-CM

## 2022-09-27 DIAGNOSIS — M25512 Pain in left shoulder: Secondary | ICD-10-CM

## 2022-09-27 DIAGNOSIS — M25511 Pain in right shoulder: Secondary | ICD-10-CM

## 2022-09-27 MED ORDER — TIZANIDINE HCL 4 MG PO TABS: 4.0000 mg | ORAL_TABLET | Freq: Four times a day (QID) | ORAL | 0 refills | Status: DC | PRN

## 2022-09-27 MED ORDER — IBUPROFEN 800 MG PO TABS: 800.0000 mg | ORAL_TABLET | Freq: Three times a day (TID) | ORAL | 0 refills | Status: DC

## 2022-09-27 NOTE — Discharge Instructions (Signed)
Advised take ibuprofen 800 mg every 8 hours with food to help reduce pain and swelling. Advised to take the Zanaflex 4 mg every 6 hours on a regular basis for the next couple days to help reduce muscle spasm and irritability. Advised ice therapy, 10 minutes on 20 minutes off, 3-4 times throughout the evening to the areas that hurt worse in order to reduce pain and inflammation. Advised to follow-up with PCP or return to urgent care if symptoms fail to improve.
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
## 2022-09-28 ENCOUNTER — Ambulatory Visit (INDEPENDENT_AMBULATORY_CARE_PROVIDER_SITE_OTHER): Payer: Self-pay | Admitting: Family Medicine

## 2022-09-28 ENCOUNTER — Encounter: Payer: Self-pay | Admitting: Family Medicine

## 2022-09-28 VITALS — BP 128/82 | HR 75 | Ht 62.0 in | Wt 131.8 lb

## 2022-09-28 DIAGNOSIS — R7303 Prediabetes: Secondary | ICD-10-CM
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
## 2022-09-29 LAB — HEMOGLOBIN A1C
Est. average glucose Bld gHb Est-mCnc: 128 mg/dL
Hgb A1c MFr Bld: 6.1 % — ABNORMAL HIGH (ref 4.8–5.6)

## 2022-10-19 ENCOUNTER — Ambulatory Visit (INDEPENDENT_AMBULATORY_CARE_PROVIDER_SITE_OTHER): Payer: Commercial Managed Care - HMO | Admitting: Student

## 2022-10-19 VITALS — BP 122/76 | HR 79 | Ht 62.0 in | Wt 131.2 lb

## 2022-10-19 DIAGNOSIS — R222 Localized swelling, mass and lump, trunk: Secondary | ICD-10-CM
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
## 2022-10-19 NOTE — Patient Instructions (Addendum)
It was great to see you today! Thank you for choosing [facility] for your primary care. Alison Perez was seen for follow up.  Today we addressed: We will continue with measurement of the area and monitor for worsening symptoms  IF YOU EXPERIENCE worsening symptoms, swelling, shortness of breath, worsening pain, please come in  If the area continues to remain unchanged in 2 weeks, give me a call and we will order the CT scan   If you haven't already, sign up for My Chart to have easy access to your labs results, and communication with your primary care physician.  I recommend that you always bring your medications to each appointment as this makes it easy to ensure you are on the correct medications and helps [place] not miss refills when you need them. Call the clinic at [phone] if your symptoms worsen or you have any concerns.  You should return to our clinic Return in about 2 weeks (around 11/02/2022), or if symptoms worsen or fail to improve, for chest wall . Please arrive 15 minutes before your appointment to ensure smooth check in process.  We appreciate your efforts in making this happen.  Thank you for allowing me to participate in your care, Erskine Emery, MD 10/19/2022, 10:32 AM PGY-2, [place]
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
## 2022-12-21 ENCOUNTER — Ambulatory Visit: Payer: Commercial Managed Care - HMO | Admitting: Student

## 2022-12-21 ENCOUNTER — Encounter: Payer: Self-pay | Admitting: Student

## 2022-12-21 VITALS — BP 132/70 | HR 82 | Ht 62.0 in | Wt 135.0 lb

## 2022-12-21 DIAGNOSIS — L03012 Cellulitis of left finger: Secondary | ICD-10-CM

## 2022-12-21 MED ORDER — DOXYCYCLINE HYCLATE 100 MG PO TABS: 100.0000 mg | ORAL_TABLET | Freq: Two times a day (BID) | ORAL | 0 refills | Status: AC

## 2022-12-21 NOTE — Progress Notes (Signed)
    SUBJECTIVE:   CHIEF COMPLAINT / HPI:   Patient is a 62 year old female presenting today for thumb injury. Felt sore around the curticle about a week ago and in the last 2 days its gotten worse. It's less painful compared to initial presentation but more swollen with some discoloration. Denies any trauma to the left thumb and no drainage.   PERTINENT  PMH / PSH: Reviewed  OBJECTIVE:   BP 132/70   Pulse 82   Ht '5\' 2"'$  (1.575 m)   Wt 135 lb (61.2 kg)   LMP 02/26/2019   SpO2 99%   BMI 24.69 kg/m    Physical Exam General: Alert, well appearing, NAD Cardiovascular: RRR,well perfused Respiratory: Normal work of breathing on room air Left thumb: area of swelling with pus collection and hyperpigmentation, tender to touch    ASSESSMENT/PLAN:    Pre-op Diagnosis: Paronychia with abscess Post-op Diagnosis: Same Procedure: Incision and drainage Location: Left thumb Performing Physician: Alen Bleacher, MD Supervising Physician (if applicable): Dr. Noel Christmas   Informed consent was obtained prior to the procedure. Time out performed. The area surrounding the skin lesion was prepared and cleaned with povidone-iodine swab stick and wiped clean with alcohol prep. Applied a mist spray over the affected and surrounding area before a scalpel was used to make a 27m incision with expression of surrounding area to drain pus. The wound was left open to heal without sutures. Standard post-procedure care was explained and return precautions and wound care handout given. Sent in prescription for doxycycline which patient will take twice daily for 5 days.   JAlen Bleacher MD [place]

## 2022-12-21 NOTE — Patient Instructions (Addendum)
  Paronychia which is inflammation of the skin on the nailbed with abscess formation.  We have drained your abscess today and have sent in prescription for antibiotics which you will take twice daily for 5 days.  Wound care Keep the affected area clean. Soak the affected area in warm water if told to do so by your health care provider. You may be told to do this for 20 minutes, 2-3 times a day. Keep the area dry when you are not soaking it. Do not try to drain an abscess yourself. Follow instructions from your health care provider about how to take care of the affected area. Make sure you: Wash your hands with soap and water for at least 20 seconds before and after you change your dressing. If soap and water are not available, use hand sanitizer. Change your dressing as told by your health care provider. If you had an abscess drained, check the area every day for signs of infection. Check for: Redness, swelling, or pain. Fluid or blood. Warmth. Pus or a bad smell.   Alen Bleacher, MD [facility]

## 2022-12-29 ENCOUNTER — Telehealth: Payer: Self-pay

## 2022-12-29 ENCOUNTER — Other Ambulatory Visit: Payer: Self-pay | Admitting: Student

## 2022-12-29 DIAGNOSIS — R222 Localized swelling, mass and lump, trunk: Secondary | ICD-10-CM

## 2022-12-29 NOTE — Telephone Encounter (Signed)
Scheduled pt [place] appt and informed her that it will be feb 8th '@10'$  a.m

## 2023-01-07 ENCOUNTER — Telehealth: Payer: Self-pay | Admitting: Family Medicine

## 2023-01-07 ENCOUNTER — Ambulatory Visit (HOSPITAL_COMMUNITY)
Admission: RE | Admit: 2023-01-07 | Discharge: 2023-01-07 | Disposition: A | Discharge status: Home / Self Care | Payer: Commercial Managed Care - HMO | Source: Ambulatory Visit | Attending: Family Medicine | Admitting: Family Medicine

## 2023-01-07 DIAGNOSIS — R222 Localized swelling, mass and lump, trunk: Secondary | ICD-10-CM | POA: Insufficient documentation

## 2023-01-07 NOTE — Telephone Encounter (Signed)
Patient is here and concerned that she went to radiology who did an ultra sound and not a CT scan.  Please call to discuss reason ultra and not CT scan.  Ph [phone] or [phone]

## 2023-01-15 ENCOUNTER — Encounter: Payer: Self-pay | Admitting: Student

## 2023-02-12 ENCOUNTER — Ambulatory Visit (INDEPENDENT_AMBULATORY_CARE_PROVIDER_SITE_OTHER): Payer: Commercial Managed Care - HMO | Admitting: Family Medicine

## 2023-02-12 ENCOUNTER — Encounter: Payer: Self-pay | Admitting: Family Medicine

## 2023-02-12 DIAGNOSIS — M25512 Pain in left shoulder: Secondary | ICD-10-CM | POA: Diagnosis not present

## 2023-02-12 DIAGNOSIS — M25562 Pain in left knee: Secondary | ICD-10-CM | POA: Diagnosis not present

## 2023-02-12 DIAGNOSIS — G8911 Acute pain due to trauma: Secondary | ICD-10-CM

## 2023-02-12 MED ORDER — CYCLOBENZAPRINE HCL 5 MG PO TABS: 5.0000 mg | ORAL_TABLET | Freq: Three times a day (TID) | ORAL | 1 refills | Status: DC | PRN

## 2023-02-12 MED ORDER — DICLOFENAC SODIUM 1 % EX GEL: 4.0000 g | Freq: Four times a day (QID) | CUTANEOUS | 0 refills | Status: DC

## 2023-02-12 MED ORDER — LIDOCAINE 4 % EX PTCH: 1.0000 | MEDICATED_PATCH | CUTANEOUS | 1 refills | Status: DC

## 2023-02-12 NOTE — Patient Instructions (Signed)
It was wonderful to see you today.  Please bring ALL of your medications with you to every visit.   Today we talked about:  Shoulder and knee pain - I prescribed a medication called flexeril. For your shoulder and neck I would advise using a heating pad that will really help relax the muscles. Also continue ibuprofen 600 mg every 6 hours and tylenol 650 mg every 6 hours.   Please follow up in 1 week   Thank you for choosing [facility].   Please call [phone] with any questions about today's appointment.  Please be sure to schedule follow up at the front desk before you leave today.   Lowry Ram, MD  Family Medicine
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
## 2023-02-18 ENCOUNTER — Ambulatory Visit: Payer: Commercial Managed Care - HMO | Admitting: Family Medicine

## 2023-02-19 ENCOUNTER — Encounter: Payer: Self-pay | Admitting: Family Medicine

## 2023-02-19 ENCOUNTER — Ambulatory Visit (INDEPENDENT_AMBULATORY_CARE_PROVIDER_SITE_OTHER): Payer: Commercial Managed Care - HMO | Admitting: Family Medicine

## 2023-02-19 ENCOUNTER — Ambulatory Visit: Payer: Commercial Managed Care - HMO | Admitting: Family Medicine

## 2023-02-19 VITALS — BP 130/80 | HR 85 | Ht 62.0 in | Wt 135.8 lb

## 2023-02-19 DIAGNOSIS — Z1239 Encounter for other screening for malignant neoplasm of breast: Secondary | ICD-10-CM | POA: Diagnosis not present

## 2023-02-19 DIAGNOSIS — R222 Localized swelling, mass and lump, trunk: Secondary | ICD-10-CM

## 2023-02-19 MED ORDER — MELOXICAM 15 MG PO TABS: 15.0000 mg | ORAL_TABLET | Freq: Every day | ORAL | 0 refills | Status: DC
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
## 2023-02-19 NOTE — Patient Instructions (Signed)
It was great seeing you today!  For your chest nodule, I have referred you to a general surgery team in case they want to see about removal versus further imaging.  They will call you in the next couple of weeks to schedule an appointment.  In the meantime for some of your pain continue doing the warm compresses, using the meloxicam once a day as needed, and I also placed a referral for physical therapy which they will call you as well in the next couple of weeks.  Feel free to call with any questions or concerns at any time, at [phone].   Take care,  Dr. Shary Key [facility]
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
## 2023-02-23 ENCOUNTER — Ambulatory Visit
Admission: RE | Admit: 2023-02-23 | Discharge: 2023-02-23 | Disposition: A | Discharge status: Home / Self Care | Payer: Commercial Managed Care - HMO | Source: Ambulatory Visit | Attending: Family Medicine | Admitting: Family Medicine

## 2023-02-23 DIAGNOSIS — Z1239 Encounter for other screening for malignant neoplasm of breast: Secondary | ICD-10-CM

## 2023-02-26 ENCOUNTER — Other Ambulatory Visit (HOSPITAL_COMMUNITY): Payer: Self-pay

## 2023-03-09 ENCOUNTER — Ambulatory Visit: Payer: Commercial Managed Care - HMO

## 2024-01-14 ENCOUNTER — Encounter (HOSPITAL_COMMUNITY): Payer: Self-pay

## 2024-01-14 ENCOUNTER — Ambulatory Visit (HOSPITAL_COMMUNITY): Payer: No Typology Code available for payment source

## 2024-01-14 ENCOUNTER — Ambulatory Visit (HOSPITAL_COMMUNITY)
Admission: EM | Admit: 2024-01-14 | Discharge: 2024-01-14 | Disposition: A | Discharge status: Home / Self Care | Payer: No Typology Code available for payment source | Attending: Family Medicine | Admitting: Family Medicine

## 2024-01-14 DIAGNOSIS — M7732 Calcaneal spur, left foot: Secondary | ICD-10-CM | POA: Diagnosis not present

## 2024-01-14 DIAGNOSIS — M79672 Pain in left foot: Secondary | ICD-10-CM

## 2024-01-14 DIAGNOSIS — K529 Noninfective gastroenteritis and colitis, unspecified: Secondary | ICD-10-CM

## 2024-01-14 DIAGNOSIS — M2042 Other hammer toe(s) (acquired), left foot: Secondary | ICD-10-CM | POA: Diagnosis not present

## 2024-01-14 DIAGNOSIS — M7662 Achilles tendinitis, left leg: Secondary | ICD-10-CM | POA: Diagnosis not present

## 2024-01-14 LAB — POC COVID19/FLU A&B COMBO
Covid Antigen, POC: NEGATIVE
Influenza A Antigen, POC: NEGATIVE
Influenza B Antigen, POC: NEGATIVE

## 2024-01-14 MED ORDER — ACETAMINOPHEN 325 MG PO TABS
650.0000 mg | ORAL_TABLET | Freq: Once | ORAL | Status: AC
  Administered 2024-01-14: 650 mg via ORAL

## 2024-01-14 MED ORDER — ACETAMINOPHEN 325 MG PO TABS
ORAL_TABLET | ORAL | Status: AC
  Filled 2024-01-14: qty 2

## 2024-01-14 MED ORDER — ONDANSETRON 4 MG PO TBDP
ORAL_TABLET | ORAL | Status: AC
  Filled 2024-01-14: qty 1

## 2024-01-14 MED ORDER — ONDANSETRON 4 MG PO TBDP
4.0000 mg | ORAL_TABLET | Freq: Once | ORAL | Status: AC
  Administered 2024-01-14: 4 mg via ORAL

## 2024-01-14 MED ORDER — ONDANSETRON 4 MG PO TBDP: 4.0000 mg | ORAL_TABLET | Freq: Three times a day (TID) | ORAL | 0 refills | Status: DC | PRN

## 2024-01-14 NOTE — ED Triage Notes (Signed)
Patient reports that she has had intermittent left foot pain x 1 year and recently a week ago. Patient also c/o ears "crackling and a headache that started today. Patient also reports that she began vomiting today.   Son states that the patient had Tylenol for her left foot at 1400 today.
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
## 2024-01-14 NOTE — Discharge Instructions (Addendum)
Your flu and COVID test was negative.  Your foot x-ray shows a little bit of arthritic change, but otherwise is normal by my review.  The radiologist will also read your x-ray, and if their interpretation differs significantly from mine, we will call you.   Ondansetron dissolved in the mouth every 8 hours as needed for nausea or vomiting. Clear liquids(water, gatorade/pedialyte, ginger ale/sprite, chicken broth/soup) and bland things(crackers/toast, rice, potato, bananas) to eat. Avoid acidic foods like lemon/lime/orange/tomato, and avoid greasy/spicy foods.  We have given you 1 dose of the ondansetron here in the clinic.  For now I would stick with Tylenol as needed for pain and fever.  That is the easiest on her stomach.  Please follow-up with your primary care about these issues  If you continue to vomit despite the medication, please go to the emergency room for further evaluation

## 2024-02-04 ENCOUNTER — Ambulatory Visit

## 2024-02-04 ENCOUNTER — Ambulatory Visit (INDEPENDENT_AMBULATORY_CARE_PROVIDER_SITE_OTHER): Admitting: Podiatry

## 2024-02-04 ENCOUNTER — Encounter: Payer: Self-pay | Admitting: Podiatry

## 2024-02-04 VITALS — Ht 62.0 in | Wt 135.0 lb

## 2024-02-04 DIAGNOSIS — M79672 Pain in left foot: Secondary | ICD-10-CM | POA: Diagnosis not present

## 2024-02-04 DIAGNOSIS — M7672 Peroneal tendinitis, left leg: Secondary | ICD-10-CM | POA: Diagnosis not present

## 2024-02-04 MED ORDER — METHYLPREDNISOLONE 4 MG PO TBPK: ORAL_TABLET | ORAL | 0 refills | Status: DC

## 2024-02-04 MED ORDER — MELOXICAM 15 MG PO TABS: 15.0000 mg | ORAL_TABLET | Freq: Every day | ORAL | 0 refills | Status: DC

## 2024-02-04 NOTE — Progress Notes (Signed)
 Chief Complaint  Patient presents with   Foot Pain    " I had an xray on 01/13/24 and still having a lot of pain and swelling in the side of my foot, I have been taking Ibuprofen and tylonel and no relief"    HPI: 63 y.o. female presents today with left lateral midfoot pain.  Patient states that she has been dealing with this type of pain for years and that when it flares up it is very difficult for her to walk.  She states that it has been severe to this level for close to a month at this point where she did have x-rays in urgent care for this.  Past Medical History:  Diagnosis Date   Diabetes mellitus without complication (HCC)    no meds. pt denies   Fibroid    Glaucoma    Hidradenitis    Hypertension    Seizures (HCC) 03/2019   " MILD "    Past Surgical History:  Procedure Laterality Date   MOUTH SURGERY     PERINEAL HIDRADENITIS EXCISION     UTERINE FIBROID SURGERY      Allergies  Allergen Reactions   Apple     Other Reaction(s): GI Intolerance   Apple Juice Nausea And Vomiting   Artichoke [Cynara Scolymus (Artichoke)] Nausea Only   Peach [Prunus Persica] Itching    ROS    Physical Exam: There were no vitals filed for this visit.  General: The patient is alert and oriented x3 in no acute distress.  Dermatology: Skin is warm, dry and supple bilateral lower extremities. Interspaces are clear of maceration and debris.    Vascular: Palpable pedal pulses bilaterally. Capillary refill within normal limits.  Diminished pedal hair growth no appreciable edema.  No erythema or calor.  Neurological: Light touch sensation grossly intact bilateral feet.   Musculoskeletal Exam: Tenderness on palpation of fifth metatarsal base and distal peroneus brevis tendon.  Some localized inflammation here.  Weakness left foot secondary to guarding.  Outside radiographs reviewed: No fracture or acute osseous abnormality.  Some calcaneal spurring present.  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 Left foot pain   2. Peroneal tendinitis of left lower extremity      Meds ordered this encounter  Medications   meloxicam (MOBIC) 15 MG tablet    Sig: Take 1 tablet (15 mg total) by mouth daily.    Dispense:  30 tablet    Refill:  0   methylPREDNISolone (MEDROL DOSEPAK) 4 MG TBPK tablet    Sig: 6 Day Tapering Dose    Dispense:  21 tablet    Refill:  0   None  Discussed clinical findings with patient today.  Findings consistent with peroneal tendinitis.  Discussed supportive bracing with relative immobilization.  Discussed stretching exercises which can be performed without the brace.  Corticosteroid injection was offered to the patient today which was deferred.  Will proceed with Medrol Dosepak followed by course of oral meloxicam.  Follow-up in 2 to 3 weeks for reevaluation.  Follow-up x-rays at this time.  Lazarus Sudbury L. Marchia Bond, AACFAS [facility]     [address], [place] [address]  Office [phone]  Fax [phone]

## 2024-02-18 ENCOUNTER — Ambulatory Visit (INDEPENDENT_AMBULATORY_CARE_PROVIDER_SITE_OTHER): Admitting: Podiatry

## 2024-02-18 ENCOUNTER — Encounter: Payer: Self-pay | Admitting: Podiatry

## 2024-02-18 DIAGNOSIS — M7672 Peroneal tendinitis, left leg: Secondary | ICD-10-CM | POA: Diagnosis not present

## 2024-02-18 NOTE — Progress Notes (Signed)
       Chief Complaint  Patient presents with   Follow-up    Patient states that everything has been fine since last visit, very little pain in right heel from time to time but the brace has helped a lot patient states.     HPI: 63 y.o. female presents today for follow-up evaluation of left foot peroneal tendinitis.  She reports significant improvement.  Has been compliant with use of the brace and completed the Medrol Dosepak and has been taking meloxicam.  Past Medical History:  Diagnosis Date   Diabetes mellitus without complication (HCC)    no meds. pt denies   Fibroid    Glaucoma    Hidradenitis    Hypertension    Seizures (HCC) 03/2019   " MILD "    Past Surgical History:  Procedure Laterality Date   MOUTH SURGERY     PERINEAL HIDRADENITIS EXCISION     UTERINE FIBROID SURGERY      Allergies  Allergen Reactions   Apple     Other Reaction(s): GI Intolerance   Apple Juice Nausea And Vomiting   Artichoke [Cynara Scolymus (Artichoke)] Nausea Only   Peach [Prunus Persica] Itching    ROS    Physical Exam: There were no vitals filed for this visit.  General: The patient is alert and oriented x3 in no acute distress.  Dermatology: Skin is warm, dry and supple bilateral lower extremities. Interspaces are clear of maceration and debris.    Vascular: Palpable pedal pulses bilaterally. Capillary refill within normal limits.  Diminished pedal hair growth no appreciable edema.  No erythema or calor.  Neurological: Light touch sensation grossly intact bilateral feet.   Musculoskeletal Exam: Tenderness on palpation of fifth metatarsal base and distal peroneus brevis tendon.  Localized inflammation improved.  Muscle strength testing 5/5 for all major muscle groups.  Outside radiographs reviewed: No fracture or acute osseous abnormality.  Some calcaneal spurring present.  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 Peroneal tendinitis of left lower  extremity      No orders of the defined types were placed in this encounter.  AMB REFERRAL TO PHYSICAL THERAPY  Discussed clinical findings with patient today.  She has had good improvement from previous with use of the brace and use of oral medication.  She does still have some tenderness on direct palpation of the peroneus brevis insertion.  Stretching and strengthening exercises discussed with the patient which can be initiated at this point.  Placed referral to physical therapy as well.  Continue the use of the brace in the interim.  Discussed importance of good supportive shoe gear.  She can take over-the-counter NSAIDs as needed upon completion of the meloxicam.  Recommend using topical Voltaren gel to be applied 3-4 times a day to the area of maximal tenderness massaged and thoroughly over several minutes.  Follow-up in 4 to 6 weeks, following initiation of physical therapy  Jorie Zee L. Marchia Bond, AACFAS [facility]     [address] [place], [place] [address]                Office [phone]  Fax [phone]

## 2024-02-24 ENCOUNTER — Ambulatory Visit: Attending: Podiatry

## 2024-02-24 ENCOUNTER — Other Ambulatory Visit: Payer: Self-pay

## 2024-02-24 DIAGNOSIS — R2689 Other abnormalities of gait and mobility: Secondary | ICD-10-CM | POA: Insufficient documentation

## 2024-02-24 DIAGNOSIS — M7672 Peroneal tendinitis, left leg: Secondary | ICD-10-CM | POA: Insufficient documentation

## 2024-02-24 DIAGNOSIS — M25572 Pain in left ankle and joints of left foot: Secondary | ICD-10-CM | POA: Diagnosis not present

## 2024-02-24 DIAGNOSIS — M6281 Muscle weakness (generalized): Secondary | ICD-10-CM | POA: Diagnosis not present
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
## 2024-03-08 ENCOUNTER — Telehealth: Payer: Self-pay

## 2024-03-08 ENCOUNTER — Ambulatory Visit: Attending: Podiatry

## 2024-03-08 DIAGNOSIS — M25572 Pain in left ankle and joints of left foot: Secondary | ICD-10-CM | POA: Insufficient documentation

## 2024-03-08 DIAGNOSIS — R2689 Other abnormalities of gait and mobility: Secondary | ICD-10-CM | POA: Insufficient documentation

## 2024-03-08 DIAGNOSIS — M6281 Muscle weakness (generalized): Secondary | ICD-10-CM | POA: Insufficient documentation

## 2024-03-08 NOTE — Telephone Encounter (Signed)
 PT called and spoke with patient regarding missed visit. Reviewed attendance policy and reminded her of her next appointment.   Eloy End   03/08/24 2:10 PM

## 2024-03-22 ENCOUNTER — Encounter: Payer: Self-pay | Admitting: Physical Therapy

## 2024-03-22 ENCOUNTER — Ambulatory Visit: Admitting: Physical Therapy

## 2024-03-22 DIAGNOSIS — M6281 Muscle weakness (generalized): Secondary | ICD-10-CM

## 2024-03-22 DIAGNOSIS — R2689 Other abnormalities of gait and mobility: Secondary | ICD-10-CM

## 2024-03-22 DIAGNOSIS — M25572 Pain in left ankle and joints of left foot: Secondary | ICD-10-CM
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
## 2024-03-31 ENCOUNTER — Ambulatory Visit: Admitting: Podiatry

## 2024-04-05 ENCOUNTER — Ambulatory Visit: Attending: Podiatry

## 2024-04-05 DIAGNOSIS — M25572 Pain in left ankle and joints of left foot: Secondary | ICD-10-CM | POA: Diagnosis not present

## 2024-04-05 DIAGNOSIS — M6281 Muscle weakness (generalized): Secondary | ICD-10-CM | POA: Insufficient documentation

## 2024-04-05 DIAGNOSIS — R2689 Other abnormalities of gait and mobility: Secondary | ICD-10-CM | POA: Insufficient documentation
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
## 2024-04-19 ENCOUNTER — Ambulatory Visit

## 2024-04-27 ENCOUNTER — Encounter: Payer: Self-pay | Admitting: Podiatry

## 2024-04-27 ENCOUNTER — Ambulatory Visit (INDEPENDENT_AMBULATORY_CARE_PROVIDER_SITE_OTHER): Admitting: Podiatry

## 2024-04-27 DIAGNOSIS — M7672 Peroneal tendinitis, left leg: Secondary | ICD-10-CM | POA: Diagnosis not present

## 2024-04-27 DIAGNOSIS — L84 Corns and callosities: Secondary | ICD-10-CM

## 2024-04-27 DIAGNOSIS — M25572 Pain in left ankle and joints of left foot: Secondary | ICD-10-CM

## 2024-04-27 MED ORDER — MELOXICAM 15 MG PO TABS: 15.0000 mg | ORAL_TABLET | Freq: Every day | ORAL | 0 refills | Status: AC

## 2024-04-27 NOTE — Progress Notes (Signed)
 Chief Complaint  Patient presents with   Foot Pain    Rm17/ Peroneal tendinitis follow up/ Patient has completed pt and doing exercises at home/ wearing afo brace and it helps with relief.pt feels some aching when standing and walking.    HPI: 63 y.o. female presents today for follow-up evaluation of left foot peroneal tendinitis, lateral hindfoot pain.  She has completed physical therapy and has been doing pretty well.  She does still endorse some soreness to the area.  Past Medical History:  Diagnosis Date   Diabetes mellitus without complication (HCC)    no meds. pt denies   Fibroid    Glaucoma    Hidradenitis    Hypertension    Seizures (HCC) 03/2019   " MILD "    Past Surgical History:  Procedure Laterality Date   MOUTH SURGERY     PERINEAL HIDRADENITIS EXCISION     UTERINE FIBROID SURGERY      Allergies  Allergen Reactions   Apple     Other Reaction(s): GI Intolerance   Apple Juice Nausea And Vomiting   Artichoke [Cynara Scolymus (Artichoke)] Nausea Only   Peach [Prunus Persica] Itching    ROS    Physical Exam: There were no vitals filed for this visit.  General: The patient is alert and oriented x3 in no acute distress.  Dermatology: Skin is warm, dry and supple bilateral lower extremities. Interspaces are clear of maceration and debris.    Vascular: Palpable pedal pulses bilaterally. Capillary refill within normal limits.  Diminished pedal hair growth no appreciable edema.  No erythema or calor.  Neurological: Light touch sensation grossly intact bilateral feet.   Musculoskeletal Exam: No tenderness on palpation of peroneal tendons today or with resisted eversion.  Muscle strength testing 5/5 for all major muscle groups.  There is some tenderness on palpation of cuboid, fifth TMT J and sinus tarsi.  Also endorses some tenderness on palpation of posterior lateral calcaneal tubercle.  Outside radiographs reviewed: No fracture or acute osseous  abnormality.  Some calcaneal spurring present.  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 Peroneal tendinitis of left lower extremity   2. Sinus tarsi syndrome of left foot   3. Callus      Meds ordered this encounter  Medications   meloxicam  (MOBIC ) 15 MG tablet    Sig: Take 1 tablet (15 mg total) by mouth daily.    Dispense:  30 tablet    Refill:  0   None  Discussed clinical findings with patient today.  Overall has had some improvement upon completion of physical therapy.  Encouraged continued use of the ASO ankle brace.  Power steps offered to patient which she deferred today.  I did encourage use of a good-quality over-the-counter insert and provided good brands of inserts and shoes for the patient to try.  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  Note provided to patient to allow for sitting when not performing essential work duties.  Extending course of oral meloxicam .  Did offer corticosteroid injection to sinus tarsi area today which patient deferred.  Some possibility of cuboid syndrome versus lateral column arthritis, will obtain radiographs at follow-up in 3 to 4 weeks.    Arlin Sass L. Lunda Salines, AACFAS [facility]     [address]  [place], [place] [address]                Office [phone]  Fax [phone]

## 2024-04-29 ENCOUNTER — Encounter: Payer: Self-pay | Admitting: Podiatry

## 2024-05-18 ENCOUNTER — Ambulatory Visit: Admitting: Podiatry

## 2024-05-24 DIAGNOSIS — Z13820 Encounter for screening for osteoporosis: Secondary | ICD-10-CM | POA: Diagnosis not present

## 2024-05-24 DIAGNOSIS — Z720 Tobacco use: Secondary | ICD-10-CM | POA: Diagnosis not present

## 2024-05-24 DIAGNOSIS — L732 Hidradenitis suppurativa: Secondary | ICD-10-CM | POA: Diagnosis not present

## 2024-05-24 DIAGNOSIS — Z1231 Encounter for screening mammogram for malignant neoplasm of breast: Secondary | ICD-10-CM | POA: Diagnosis not present

## 2024-05-24 DIAGNOSIS — Z0000 Encounter for general adult medical examination without abnormal findings: Secondary | ICD-10-CM | POA: Diagnosis not present

## 2024-05-24 DIAGNOSIS — E7800 Pure hypercholesterolemia, unspecified: Secondary | ICD-10-CM | POA: Diagnosis not present

## 2024-05-24 DIAGNOSIS — R7303 Prediabetes: Secondary | ICD-10-CM | POA: Diagnosis not present

## 2024-05-24 DIAGNOSIS — Z122 Encounter for screening for malignant neoplasm of respiratory organs: Secondary | ICD-10-CM | POA: Diagnosis not present

## 2024-05-24 DIAGNOSIS — D649 Anemia, unspecified: Secondary | ICD-10-CM | POA: Diagnosis not present

## 2024-05-24 DIAGNOSIS — Z13220 Encounter for screening for lipoid disorders: Secondary | ICD-10-CM | POA: Diagnosis not present

## 2024-05-25 ENCOUNTER — Ambulatory Visit: Admitting: Podiatry

## 2024-05-30 DIAGNOSIS — Z13820 Encounter for screening for osteoporosis: Secondary | ICD-10-CM | POA: Diagnosis not present

## 2024-05-30 DIAGNOSIS — F17210 Nicotine dependence, cigarettes, uncomplicated: Secondary | ICD-10-CM | POA: Diagnosis not present

## 2024-05-30 DIAGNOSIS — Z1231 Encounter for screening mammogram for malignant neoplasm of breast: Secondary | ICD-10-CM | POA: Diagnosis not present

## 2024-05-30 DIAGNOSIS — Z780 Asymptomatic menopausal state: Secondary | ICD-10-CM | POA: Diagnosis not present

## 2024-06-23 ENCOUNTER — Ambulatory Visit (HOSPITAL_COMMUNITY): Admission: EM | Admit: 2024-06-23 | Discharge: 2024-06-23 | Disposition: A | Discharge status: Home / Self Care

## 2024-06-23 ENCOUNTER — Encounter (HOSPITAL_COMMUNITY): Payer: Self-pay | Admitting: *Deleted

## 2024-06-23 ENCOUNTER — Ambulatory Visit (INDEPENDENT_AMBULATORY_CARE_PROVIDER_SITE_OTHER)

## 2024-06-23 DIAGNOSIS — R0781 Pleurodynia: Secondary | ICD-10-CM

## 2024-06-23 DIAGNOSIS — I700 Atherosclerosis of aorta: Secondary | ICD-10-CM | POA: Diagnosis not present

## 2024-06-23 MED ORDER — CYCLOBENZAPRINE HCL 5 MG PO TABS: 5.0000 mg | ORAL_TABLET | Freq: Three times a day (TID) | ORAL | 0 refills | Status: DC | PRN

## 2024-06-23 NOTE — ED Triage Notes (Signed)
 Pt states she fell yesterday and hit her left side on a stand with some ceramic figures. She hasn't taken anything for the pain. Pain worse with coughing and getting up to stand.

## 2024-06-23 NOTE — Discharge Instructions (Addendum)
 X-ray done today.  Final evaluation by the radiologist does not show any acute fracture.  This is reassuring although rib injuries can be very painful and take time to completely resolve.  We will treat with the following: Flexeril  5 mg every 8 hours as needed for muscle spasms.  Use caution as this medication can cause drowsiness.  May use Tylenol  to help with the pain Ice the area 2-3 times daily for 10-15 minutes to help with pain and swelling. Do not apply ice directly to the skin.   Make sure to continue to take deep breaths in and out. Return to urgent care or PCP if symptoms worsen or fail to resolve.
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
## 2024-06-26 DIAGNOSIS — D649 Anemia, unspecified: Secondary | ICD-10-CM | POA: Diagnosis not present

## 2024-09-19 ENCOUNTER — Ambulatory Visit (INDEPENDENT_AMBULATORY_CARE_PROVIDER_SITE_OTHER)

## 2024-09-19 ENCOUNTER — Ambulatory Visit (HOSPITAL_COMMUNITY): Attending: Family Medicine

## 2024-09-19 ENCOUNTER — Encounter (HOSPITAL_COMMUNITY): Payer: Self-pay | Admitting: *Deleted

## 2024-09-19 ENCOUNTER — Ambulatory Visit (HOSPITAL_COMMUNITY)
Admission: EM | Admit: 2024-09-19 | Discharge: 2024-09-19 | Disposition: A | Discharge status: Home / Self Care | Attending: Family Medicine | Admitting: Family Medicine

## 2024-09-19 DIAGNOSIS — M549 Dorsalgia, unspecified: Secondary | ICD-10-CM | POA: Diagnosis not present

## 2024-09-19 DIAGNOSIS — M25562 Pain in left knee: Secondary | ICD-10-CM

## 2024-09-19 DIAGNOSIS — M542 Cervicalgia: Secondary | ICD-10-CM | POA: Diagnosis not present

## 2024-09-19 DIAGNOSIS — R5383 Other fatigue: Secondary | ICD-10-CM

## 2024-09-19 MED ORDER — TIZANIDINE HCL 4 MG PO TABS: 4.0000 mg | ORAL_TABLET | Freq: Three times a day (TID) | ORAL | 0 refills | Status: AC | PRN
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
